# Patient Record
Sex: Female | Born: 1965
Health system: Southern US, Community
[De-identification: ages and names within clinical notes are randomized; demographics above are authoritative.]

## PROBLEM LIST (undated history)

## (undated) DIAGNOSIS — Z9889 Other specified postprocedural states: Secondary | ICD-10-CM

## (undated) DIAGNOSIS — R112 Nausea with vomiting, unspecified: Secondary | ICD-10-CM

## (undated) DIAGNOSIS — K2970 Gastritis, unspecified, without bleeding: Secondary | ICD-10-CM

## (undated) DIAGNOSIS — M5030 Other cervical disc degeneration, unspecified cervical region: Secondary | ICD-10-CM

## (undated) DIAGNOSIS — J189 Pneumonia, unspecified organism: Secondary | ICD-10-CM

## (undated) DIAGNOSIS — G8929 Other chronic pain: Secondary | ICD-10-CM

## (undated) DIAGNOSIS — E119 Type 2 diabetes mellitus without complications: Secondary | ICD-10-CM

## (undated) DIAGNOSIS — F419 Anxiety disorder, unspecified: Secondary | ICD-10-CM

## (undated) DIAGNOSIS — K219 Gastro-esophageal reflux disease without esophagitis: Secondary | ICD-10-CM

## (undated) DIAGNOSIS — M545 Low back pain, unspecified: Secondary | ICD-10-CM

## (undated) DIAGNOSIS — M5412 Radiculopathy, cervical region: Secondary | ICD-10-CM

## (undated) DIAGNOSIS — J309 Allergic rhinitis, unspecified: Secondary | ICD-10-CM

## (undated) HISTORY — DX: Low back pain, unspecified: M54.50

## (undated) HISTORY — PX: RIGHT OOPHORECTOMY: SHX2359

## (undated) HISTORY — DX: Allergic rhinitis, unspecified: J30.9

## (undated) HISTORY — DX: Other chronic pain: G89.29

## (undated) HISTORY — DX: Gastritis, unspecified, without bleeding: K29.70

## (undated) HISTORY — PX: TUBAL LIGATION: SHX77

## (undated) HISTORY — DX: Pneumonia, unspecified organism: J18.9

## (undated) HISTORY — DX: Anxiety disorder, unspecified: F41.9

## (undated) HISTORY — PX: ABDOMINAL HYSTERECTOMY: SHX81

## (undated) HISTORY — DX: Low back pain: M54.5

---

## 2003-05-01 DIAGNOSIS — L709 Acne, unspecified: Secondary | ICD-10-CM | POA: Insufficient documentation

## 2005-01-12 DIAGNOSIS — F4321 Adjustment disorder with depressed mood: Secondary | ICD-10-CM | POA: Insufficient documentation

## 2005-01-12 DIAGNOSIS — Z634 Disappearance and death of family member: Secondary | ICD-10-CM | POA: Insufficient documentation

## 2007-10-02 ENCOUNTER — Ambulatory Visit: Payer: Self-pay | Admitting: Obstetrics and Gynecology

## 2007-10-10 ENCOUNTER — Ambulatory Visit: Payer: Self-pay | Admitting: Obstetrics and Gynecology

## 2008-09-08 DIAGNOSIS — Z90721 Acquired absence of ovaries, unilateral: Secondary | ICD-10-CM | POA: Insufficient documentation

## 2012-07-28 ENCOUNTER — Emergency Department: Payer: Self-pay | Admitting: Emergency Medicine

## 2014-03-20 DIAGNOSIS — Z90711 Acquired absence of uterus with remaining cervical stump: Secondary | ICD-10-CM | POA: Insufficient documentation

## 2014-03-20 DIAGNOSIS — M5030 Other cervical disc degeneration, unspecified cervical region: Secondary | ICD-10-CM | POA: Insufficient documentation

## 2014-03-31 DIAGNOSIS — M5412 Radiculopathy, cervical region: Secondary | ICD-10-CM | POA: Insufficient documentation

## 2015-07-14 ENCOUNTER — Emergency Department
Admission: EM | Admit: 2015-07-14 | Discharge: 2015-07-14 | Disposition: A | Discharge status: Home / Self Care | Payer: 59 | Attending: Emergency Medicine | Admitting: Emergency Medicine

## 2015-07-14 ENCOUNTER — Emergency Department: Payer: 59

## 2015-07-14 DIAGNOSIS — R110 Nausea: Secondary | ICD-10-CM

## 2015-07-14 DIAGNOSIS — J159 Unspecified bacterial pneumonia: Secondary | ICD-10-CM | POA: Diagnosis not present

## 2015-07-14 DIAGNOSIS — J019 Acute sinusitis, unspecified: Secondary | ICD-10-CM | POA: Diagnosis not present

## 2015-07-14 DIAGNOSIS — R05 Cough: Secondary | ICD-10-CM | POA: Diagnosis present

## 2015-07-14 DIAGNOSIS — J189 Pneumonia, unspecified organism: Secondary | ICD-10-CM

## 2015-07-14 LAB — CBC WITH DIFFERENTIAL/PLATELET
BASOS PCT: 1 %
Basophils Absolute: 0.1 10*3/uL (ref 0–0.1)
Eosinophils Absolute: 0.1 10*3/uL (ref 0–0.7)
Eosinophils Relative: 2 %
HEMATOCRIT: 40.5 % (ref 35.0–47.0)
HEMOGLOBIN: 13.9 g/dL (ref 12.0–16.0)
Lymphocytes Relative: 7 %
Lymphs Abs: 0.4 10*3/uL — ABNORMAL LOW (ref 1.0–3.6)
MCH: 29.8 pg (ref 26.0–34.0)
MCHC: 34.2 g/dL (ref 32.0–36.0)
MCV: 87.1 fL (ref 80.0–100.0)
MONOS PCT: 9 %
Monocytes Absolute: 0.5 10*3/uL (ref 0.2–0.9)
NEUTROS ABS: 4.1 10*3/uL (ref 1.4–6.5)
NEUTROS PCT: 81 %
Platelets: 200 10*3/uL (ref 150–440)
RBC: 4.66 MIL/uL (ref 3.80–5.20)
RDW: 13.3 % (ref 11.5–14.5)
WBC: 5.1 10*3/uL (ref 3.6–11.0)

## 2015-07-14 LAB — COMPREHENSIVE METABOLIC PANEL
ALBUMIN: 3.8 g/dL (ref 3.5–5.0)
ALK PHOS: 83 U/L (ref 38–126)
ALT: 42 U/L (ref 14–54)
ANION GAP: 6 (ref 5–15)
AST: 30 U/L (ref 15–41)
BILIRUBIN TOTAL: 0.5 mg/dL (ref 0.3–1.2)
BUN: 11 mg/dL (ref 6–20)
CALCIUM: 8.8 mg/dL — AB (ref 8.9–10.3)
CO2: 26 mmol/L (ref 22–32)
CREATININE: 0.68 mg/dL (ref 0.44–1.00)
Chloride: 107 mmol/L (ref 101–111)
GFR calc Af Amer: >60 mL/min (ref >60)
GFR calc non Af Amer: >60 mL/min (ref >60)
GLUCOSE: 145 mg/dL — AB (ref 65–99)
Potassium: 3.8 mmol/L (ref 3.5–5.1)
Sodium: 139 mmol/L (ref 135–145)
TOTAL PROTEIN: 7.1 g/dL (ref 6.5–8.1)

## 2015-07-14 LAB — URINALYSIS COMPLETE WITH MICROSCOPIC (ARMC ONLY)
BILIRUBIN URINE: NEGATIVE
GLUCOSE, UA: 150 mg/dL — AB
Ketones, ur: NEGATIVE mg/dL
Leukocytes, UA: NEGATIVE
Nitrite: NEGATIVE
Protein, ur: NEGATIVE mg/dL
Specific Gravity, Urine: 1.024 (ref 1.005–1.030)
pH: 5 (ref 5.0–8.0)

## 2015-07-14 LAB — RAPID INFLUENZA A&B ANTIGENS: Influenza B (ARMC): NOT DETECTED

## 2015-07-14 LAB — RAPID INFLUENZA A&B ANTIGENS (ARMC ONLY): INFLUENZA A (ARMC): NOT DETECTED

## 2015-07-14 LAB — POCT RAPID STREP A: STREPTOCOCCUS, GROUP A SCREEN (DIRECT): NEGATIVE

## 2015-07-14 MED ORDER — ACETAMINOPHEN 500 MG PO TABS
1000.0000 mg | ORAL_TABLET | Freq: Once | ORAL | Status: AC
  Administered 2015-07-14: 1000 mg via ORAL
  Filled 2015-07-14: qty 2

## 2015-07-14 MED ORDER — SODIUM CHLORIDE 0.9 % IV SOLN
Freq: Once | INTRAVENOUS | Status: AC
  Administered 2015-07-14: 07:00:00 via INTRAVENOUS

## 2015-07-14 MED ORDER — LIDOCAINE VISCOUS 2 % MT SOLN
15.0000 mL | Freq: Once | OROMUCOSAL | Status: AC
  Administered 2015-07-14: 15 mL via OROMUCOSAL
  Filled 2015-07-14: qty 15

## 2015-07-14 MED ORDER — HYDROCOD POLST-CPM POLST ER 10-8 MG/5ML PO SUER: 5.0000 mL | Freq: Two times a day (BID) | ORAL | Status: DC

## 2015-07-14 MED ORDER — LEVOFLOXACIN IN D5W 750 MG/150ML IV SOLN
750.0000 mg | Freq: Once | INTRAVENOUS | Status: AC
  Administered 2015-07-14: 750 mg via INTRAVENOUS
  Filled 2015-07-14: qty 150

## 2015-07-14 MED ORDER — LEVOFLOXACIN 750 MG PO TABS: 750.0000 mg | ORAL_TABLET | Freq: Every day | ORAL | Status: DC

## 2015-07-14 MED ORDER — IOHEXOL 350 MG/ML SOLN
100.0000 mL | Freq: Once | INTRAVENOUS | Status: AC | PRN
  Administered 2015-07-14: 100 mL via INTRAVENOUS

## 2015-07-14 MED ORDER — ONDANSETRON HCL 4 MG/2ML IJ SOLN
4.0000 mg | Freq: Once | INTRAMUSCULAR | Status: AC
  Administered 2015-07-14: 4 mg via INTRAVENOUS
  Filled 2015-07-14: qty 2

## 2015-07-14 MED ORDER — ONDANSETRON HCL 4 MG PO TABS: 4.0000 mg | ORAL_TABLET | Freq: Every day | ORAL | Status: DC | PRN

## 2015-07-14 NOTE — ED Provider Notes (Addendum)
[facility] Emergency Department Provider Note     Time seen: ----------------------------------------- 7:30 AM on 07/14/2015 -----------------------------------------    I have reviewed the triage vital signs and the nursing notes.   HISTORY  Chief Complaint Cough    HPI Tara Young is a 50 y.o. female who presents ER with cough, fever, sore throat since Thursday. Patient said increasing cough shortness of breath. She's been taking over-the-counter medications without any relief. She's had persistent pain with deep breathing and cough, also sinus pressure. Nothing is made her symptoms better.   History reviewed. No pertinent past medical history.  There are no active problems to display for this patient.   Past Surgical History  Procedure Laterality Date  . Abdominal hysterectomy      Allergies Review of patient's allergies indicates no known allergies.  Social History Social History  Substance Use Topics  . Smoking status: Never Smoker   . Smokeless tobacco: None  . Alcohol Use: No    Review of Systems Constitutional: Positive for fever Eyes: Negative for visual changes. ENT: Positive for sore throat and sinus pressure Cardiovascular: Negative for chest pain. Respiratory: Positive for shortness of breath and cough Gastrointestinal: Negative for abdominal pain, positive for nausea Genitourinary: Negative for dysuria. Musculoskeletal: Negative for back pain. Skin: Negative for rash. Neurological: Negative for headaches, positive for weakness  10-point ROS otherwise negative.  ____________________________________________   PHYSICAL EXAM:  VITAL SIGNS: ED Triage Vitals  Enc Vitals Group     BP 07/14/15 0604 136/99 mmHg     Pulse Rate 07/14/15 0604 122     Resp 07/14/15 0604 18     Temp 07/14/15 0604 101 F (38.3 C)     Temp Source 07/14/15 0604 Oral     SpO2 07/14/15 0604 96 %     Weight 07/14/15 0604 180 lb (81.647  kg)     Height 07/14/15 0604 5\' 6"  (1.676 m)     Head Cir --      Peak Flow --      Pain Score 07/14/15 0605 4     Pain Loc --      Pain Edu? --      Excl. in [place]? --     Constitutional: Alert and oriented. Well appearing and in no distress. Eyes: Conjunctivae are normal. PERRL. Normal extraocular movements. ENT   Head: Normocephalic and atraumatic. Tenderness over the maxillary sinuses bilaterally   Nose: No congestion/rhinnorhea.   Mouth/Throat: Mucous membranes are moist.   Neck: No stridor. Cardiovascular: Normal rate, regular rhythm. Normal and symmetric distal pulses are present in all extremities. No murmurs, rubs, or gallops. Respiratory: Normal respiratory effort without tachypnea nor retractions. Breath sounds are clear and equal bilaterally. No wheezes/rales/rhonchi. Gastrointestinal: Soft and nontender. No distention. No abdominal bruits.  Musculoskeletal: Nontender with normal range of motion in all extremities. No joint effusions.  No lower extremity tenderness nor edema. Neurologic:  Normal speech and language. No gross focal neurologic deficits are appreciated. Speech is normal. No gait instability. Skin:  Skin is warm, dry and intact. No rash noted. Psychiatric: Mood and affect are normal. Speech and behavior are normal. Patient exhibits appropriate insight and judgment. ____________________________________________  ED COURSE:  Pertinent labs & imaging results that were available during my care of the patient were reviewed by me and considered in my medical decision making (see chart for details). Patient is in no acute distress, will check basic labs and reevaluate. Also obtained chest x-ray ____________________________________________  LABS (pertinent positives/negatives)  Labs Reviewed  CBC WITH DIFFERENTIAL/PLATELET - Abnormal; Notable for the following:    Lymphs Abs 0.4 (*)    All other components within normal limits  COMPREHENSIVE METABOLIC  PANEL - Abnormal; Notable for the following:    Glucose, Bld 145 (*)    Calcium 8.8 (*)    All other components within normal limits  URINALYSIS COMPLETEWITH MICROSCOPIC ([facility] ONLY) - Abnormal; Notable for the following:    Color, Urine YELLOW (*)    APPearance CLEAR (*)    Glucose, UA 150 (*)    Hgb urine dipstick 1+ (*)    Bacteria, UA MANY (*)    Squamous Epithelial / LPF 0-5 (*)    All other components within normal limits  RAPID INFLUENZA A&B ANTIGENS ([facility] ONLY)  POCT RAPID STREP A   EKG: Interpreted by me, sinus tachycardia with a rate of 125 bpm, normal PR interval, normal QRS, normal QT interval. Left anterior fascicular block  RADIOLOGY  Chest x-ray IMPRESSION: Mild patchy bilateral airspace opacities raise concern for pneumonia. ____________________________________________  FINAL ASSESSMENT AND PLAN  Pneumonia, sinusitis  Plan: Patient with labs and imaging as dictated above. Patient does not appear to be in any acute distress, she was started on IV fluids as well as IV Zofran and Levaquin. She will be discharged with Levaquin, Tussionex and Zofran. She is stable for outpatient follow-up with her doctor.   Earleen Newport, MD   Earleen Newport, MD 07/14/15 [id number]  Earleen Newport, MD 07/14/15 [phone]

## 2015-07-14 NOTE — ED Notes (Addendum)
Pt c/o cough with fever and sore throat intermit since Thursday night.states some CP with cough and also increased SOB. Denies any body aches or chills. Pt A&O. Pt has been taking niquel and ibuprofen at home with no relief

## 2015-07-14 NOTE — ED Notes (Signed)
Pt arrived to ED with c/o cough, chills, and pain in chest with deep breathing and cough.

## 2015-07-14 NOTE — ED Notes (Signed)
MD at bedside. 

## 2015-07-14 NOTE — Discharge Instructions (Signed)
Community-Acquired Pneumonia, Adult Pneumonia is an infection of the lungs. There are different types of pneumonia. One type can develop while a person is in a hospital. A different type, called community-acquired pneumonia, develops in people who are not, or have not recently been, in the hospital or other health care facility.  CAUSES Pneumonia may be caused by bacteria, viruses, or funguses. Community-acquired pneumonia is often caused by Streptococcus pneumonia bacteria. These bacteria are often passed from one person to another by breathing in droplets from the cough or sneeze of an infected person. RISK FACTORS The condition is more likely to develop in:  People who havechronic diseases, such as chronic obstructive pulmonary disease (COPD), asthma, congestive heart failure, cystic fibrosis, diabetes, or kidney disease.  People who haveearly-stage or late-stage HIV.  People who havesickle cell disease.  People who havehad their spleen removed (splenectomy).  People who havepoor Human resources officer.  People who havemedical conditions that increase the risk of breathing in (aspirating) secretions their own mouth and nose.   People who havea weakened immune system (immunocompromised).  People who smoke.  People whotravel to areas where pneumonia-causing germs commonly exist.  People whoare around animal habitats or animals that have pneumonia-causing germs, including birds, bats, rabbits, cats, and farm animals. SYMPTOMS Symptoms of this condition include:  Adry cough.  A wet (productive) cough.  Fever.  Sweating.  Chest pain, especially when breathing deeply or coughing.  Rapid breathing or difficulty breathing.  Shortness of breath.  Shaking chills.  Fatigue.  Muscle aches. DIAGNOSIS Your health care provider will take a medical history and perform a physical exam. You may also have other tests, including:  Imaging studies of your chest, including  X-rays.  Tests to check your blood oxygen level and other blood gases.  Other tests on blood, mucus (sputum), fluid around your lungs (pleural fluid), and urine. If your pneumonia is severe, other tests may be done to identify the specific cause of your illness. TREATMENT The type of treatment that you receive depends on many factors, such as the cause of your pneumonia, the medicines you take, and other medical conditions that you have. For most adults, treatment and recovery from pneumonia may occur at home. In some cases, treatment must happen in a hospital. Treatment may include:  Antibiotic medicines, if the pneumonia was caused by bacteria.  Antiviral medicines, if the pneumonia was caused by a virus.  Medicines that are given by mouth or through an IV tube.  Oxygen.  Respiratory therapy. Although rare, treating severe pneumonia may include:  Mechanical ventilation. This is done if you are not breathing well on your own and you cannot maintain a safe blood oxygen level.  Thoracentesis. This procedureremoves fluid around one lung or both lungs to help you breathe better. HOME CARE INSTRUCTIONS  Take over-the-counter and prescription medicines only as told by your health care provider.  Only takecough medicine if you are losing sleep. Understand that cough medicine can prevent your body's natural ability to remove mucus from your lungs.  If you were prescribed an antibiotic medicine, take it as told by your health care provider. Do not stop taking the antibiotic even if you start to feel better.  Sleep in a semi-upright position at night. Try sleeping in a reclining chair, or place a few pillows under your head.  Do not use tobacco products, including cigarettes, chewing tobacco, and e-cigarettes. If you need help quitting, ask your health care provider.  Drink enough water to keep your urine  clear or pale yellow. This will help to thin out mucus secretions in your  lungs. PREVENTION There are ways that you can decrease your risk of developing community-acquired pneumonia. Consider getting a pneumococcal vaccine if:  You are older than 50 years of age.  You are older than 50 years of age and are undergoing cancer treatment, have chronic lung disease, or have other medical conditions that affect your immune system. Ask your health care provider if this applies to you. There are different types and schedules of pneumococcal vaccines. Ask your health care provider which vaccination option is best for you. You may also prevent community-acquired pneumonia if you take these actions:  Get an influenza vaccine every year. Ask your health care provider which type of influenza vaccine is best for you.  Go to the dentist on a regular basis.  Wash your hands often. Use hand sanitizer if soap and water are not available. SEEK MEDICAL CARE IF:  You have a fever.  You are losing sleep because you cannot control your cough with cough medicine. SEEK IMMEDIATE MEDICAL CARE IF:  You have worsening shortness of breath.  You have increased chest pain.  Your sickness becomes worse, especially if you are an older adult or have a weakened immune system.  You cough up blood.   This information is not intended to replace advice given to you by your health care provider. Make sure you discuss any questions you have with your health care provider.   Document Released: 05/08/2005 Document Revised: 01/27/2015 Document Reviewed: 09/02/2014 Elsevier Interactive Patient Education 2016 Else[employer]  Nausea, Adult Nausea means you feel sick to your stomach or need to throw up (vomit). It may be a sign of a more serious problem. If nausea gets worse, you may throw up. If you throw up a lot, you may lose too much body fluid (dehydration). HOME CARE   Get plenty of rest.  Ask your doctor how to replace body fluid losses (rehydrate).  Eat small amounts of food. Sip  liquids more often.  Take all medicines as told by your doctor. GET HELP RIGHT AWAY IF:  You have a fever.  You pass out (faint).  You keep throwing up or have blood in your throw up.  You are very weak, have dry lips or a dry mouth, or you are very thirsty (dehydrated).  You have dark or bloody poop (stool).  You have very bad chest or belly (abdominal) pain.  You do not get better after 2 days, or you get worse.  You have a headache. MAKE SURE YOU:  Understand these instructions.  Will watch your condition.  Will get help right away if you are not doing well or get worse.   This information is not intended to replace advice given to you by your health care provider. Make sure you discuss any questions you have with your health care provider.   Document Released: 04/27/2011 Document Revised: 07/31/2011 Document Reviewed: 04/27/2011 Elsevier Interactive Patient Education 2016 Elsevier Inc.  Sinusitis, Adult Sinusitis is redness, soreness, and inflammation of the paranasal sinuses. Paranasal sinuses are air pockets within the bones of your face. They are located beneath your eyes, in the middle of your forehead, and above your eyes. In healthy paranasal sinuses, mucus is able to drain out, and air is able to circulate through them by way of your nose. However, when your paranasal sinuses are inflamed, mucus and air can become trapped. This can allow bacteria and other  germs to grow and cause infection. Sinusitis can develop quickly and last only a short time (acute) or continue over a long period (chronic). Sinusitis that lasts for more than 12 weeks is considered chronic. CAUSES Causes of sinusitis include:  Allergies.  Structural abnormalities, such as displacement of the cartilage that separates your nostrils (deviated septum), which can decrease the air flow through your nose and sinuses and affect sinus drainage.  Functional abnormalities, such as when the small hairs  (cilia) that line your sinuses and help remove mucus do not work properly or are not present. SIGNS AND SYMPTOMS Symptoms of acute and chronic sinusitis are the same. The primary symptoms are pain and pressure around the affected sinuses. Other symptoms include:  Upper toothache.  Earache.  Headache.  Bad breath.  Decreased sense of smell and taste.  A cough, which worsens when you are lying flat.  Fatigue.  Fever.  Thick drainage from your nose, which often is green and may contain pus (purulent).  Swelling and warmth over the affected sinuses. DIAGNOSIS Your health care provider will perform a physical exam. During your exam, your health care provider may perform any of the following to help determine if you have acute sinusitis or chronic sinusitis:  Look in your nose for signs of abnormal growths in your nostrils (nasal polyps).  Tap over the affected sinus to check for signs of infection.  View the inside of your sinuses using an imaging device that has a light attached (endoscope). If your health care provider suspects that you have chronic sinusitis, one or more of the following tests may be recommended:  Allergy tests.  Nasal culture. A sample of mucus is taken from your nose, sent to a lab, and screened for bacteria.  Nasal cytology. A sample of mucus is taken from your nose and examined by your health care provider to determine if your sinusitis is related to an allergy. TREATMENT Most cases of acute sinusitis are related to a viral infection and will resolve on their own within 10 days. Sometimes, medicines are prescribed to help relieve symptoms of both acute and chronic sinusitis. These may include pain medicines, decongestants, nasal steroid sprays, or saline sprays. However, for sinusitis related to a bacterial infection, your health care provider will prescribe antibiotic medicines. These are medicines that will help kill the bacteria causing the  infection. Rarely, sinusitis is caused by a fungal infection. In these cases, your health care provider will prescribe antifungal medicine. For some cases of chronic sinusitis, surgery is needed. Generally, these are cases in which sinusitis recurs more than 3 times per year, despite other treatments. HOME CARE INSTRUCTIONS  Drink plenty of water. Water helps thin the mucus so your sinuses can drain more easily.  Use a humidifier.  Inhale steam 3-4 times a day (for example, sit in the bathroom with the shower running).  Apply a warm, moist washcloth to your face 3-4 times a day, or as directed by your health care provider.  Use saline nasal sprays to help moisten and clean your sinuses.  Take medicines only as directed by your health care provider.  If you were prescribed either an antibiotic or antifungal medicine, finish it all even if you start to feel better. SEEK IMMEDIATE MEDICAL CARE IF:  You have increasing pain or severe headaches.  You have nausea, vomiting, or drowsiness.  You have swelling around your face.  You have vision problems.  You have a stiff neck.  You have difficulty breathing.  This information is not intended to replace advice given to you by your health care provider. Make sure you discuss any questions you have with your health care provider.   Document Released: 05/08/2005 Document Revised: 05/29/2014 Document Reviewed: 05/23/2011 Elsevier Interactive Patient Education [employer].

## 2015-07-14 NOTE — ED Notes (Signed)
Returned from CT.

## 2015-07-14 NOTE — ED Notes (Signed)
Pt up and walked around room without nasal cannula, tolerated well. O2 sats stayed at 96%

## 2015-07-14 NOTE — ED Notes (Signed)
Pt c/o nausea again. Dry heaving when RN in room.

## 2015-07-14 NOTE — ED Notes (Signed)
Will DC patient once abx done, approx 90 min

## 2015-07-14 NOTE — ED Notes (Signed)
Pt given fluids, tolerating well. 

## 2015-08-10 ENCOUNTER — Other Ambulatory Visit: Payer: Self-pay | Admitting: Infectious Diseases

## 2015-08-10 DIAGNOSIS — J189 Pneumonia, unspecified organism: Secondary | ICD-10-CM

## 2015-08-13 ENCOUNTER — Ambulatory Visit
Admission: RE | Admit: 2015-08-13 | Discharge: 2015-08-13 | Disposition: A | Discharge status: Home / Self Care | Payer: 59 | Source: Ambulatory Visit | Attending: Infectious Diseases | Admitting: Infectious Diseases

## 2015-08-13 DIAGNOSIS — J189 Pneumonia, unspecified organism: Secondary | ICD-10-CM | POA: Diagnosis not present

## 2015-08-31 ENCOUNTER — Institutional Professional Consult (permissible substitution): Payer: 59 | Admitting: Internal Medicine

## 2015-10-12 ENCOUNTER — Telehealth: Payer: Self-pay

## 2015-10-12 NOTE — Telephone Encounter (Signed)
Called Dr.Fitzgerald's office to request records for pt. Upcoming appt 5/25. Was told nurse would be faxing records over. Nothing further needed.

## 2015-10-14 ENCOUNTER — Other Ambulatory Visit
Admission: RE | Admit: 2015-10-14 | Discharge: 2015-10-14 | Disposition: A | Discharge status: Home / Self Care | Payer: 59 | Source: Ambulatory Visit | Attending: Internal Medicine | Admitting: Internal Medicine

## 2015-10-14 ENCOUNTER — Ambulatory Visit (INDEPENDENT_AMBULATORY_CARE_PROVIDER_SITE_OTHER): Payer: 59 | Admitting: Internal Medicine

## 2015-10-14 ENCOUNTER — Encounter: Payer: Self-pay | Admitting: Internal Medicine

## 2015-10-14 VITALS — BP 130/76 | HR 101 | Ht 66.0 in | Wt 200.0 lb

## 2015-10-14 DIAGNOSIS — D861 Sarcoidosis of lymph nodes: Secondary | ICD-10-CM | POA: Diagnosis not present

## 2015-10-14 DIAGNOSIS — Z0000 Encounter for general adult medical examination without abnormal findings: Secondary | ICD-10-CM | POA: Diagnosis not present

## 2015-10-14 DIAGNOSIS — R590 Localized enlarged lymph nodes: Secondary | ICD-10-CM

## 2015-10-14 MED ORDER — ALBUTEROL SULFATE HFA 108 (90 BASE) MCG/ACT IN AERS: 2.0000 | INHALATION_SPRAY | Freq: Four times a day (QID) | RESPIRATORY_TRACT | Status: DC | PRN

## 2015-10-14 MED ORDER — FLUTICASONE PROPIONATE 50 MCG/ACT NA SUSP: 1.0000 | Freq: Every day | NASAL | Status: DC

## 2015-10-14 NOTE — Patient Instructions (Addendum)
--  Serum ACE (angiotensin converting enzyme).  --Full PFT (before next visit in 6 months).  --Ct scan with contrast in 6 months-- follow up after that.  --Flonase 2 sprays in each nostril once daily.  --Proair inhaler, 2 puffs as needed for trouble breathing.

## 2015-10-14 NOTE — Progress Notes (Addendum)
Tara Young     Addendum 06/12/16 Patient underwent CT of the abdomen on 03/02/16 secondary to adnexal mass, images reviewed. Lower parts of the lungs were seen, showed no change in previously seen interstitial changes, however, the upper parts, however, the upper parts of the lungs where lymphadenopathy and interstitial changes were greatest were not seen on the scan.  I will wait to see her in follow-up and see if she is symptomatic, in regards to whether we will start treatment for sarcoidosis, versus continued surveillance. Given her 3 CT scans in the past year. I would prefer not to repeat another CT scan at this time.   Assessment and Plan:  Mediastinal lymphadenopathy. -Calcified lymphadenopathy with scattered nodular changes seen in both lungs, greatest in the right middle lobe and lingula, these changes on the most recent scan from 08/13/15 appear improved from previous film from 1 month prior. -These changes would be most consistent with granulomatous disease, possibly sarcoidosis. Given that her symptoms have dramatically improved, I would not proceed with invasive testing at this time, but we'll continue to monitor with repeat CAT scan in approximately 6 months. -We'll check a serum ace level. -PFT before next visit  Asthma. -She continues to have occasional episodic dyspnea which happens once a week or less. -She is given a prescription for albuterol and inhaler to be used when necessary.  Pneumonia. -As above, likely due to granulomatous disease. This appears to have resolved.  Allergic Rhinitis.  -The patient has a cough, which she feels is associated with postnasal drip. -We'll start Flonase nasal spray daily.  Date: 10/14/2015  MRN# [id number] Tara Young 1966/03/29  Referring Physician: Dr. Ola Young.   Tara Young is a 50 y.o. old female seen in [place] for chief complaint of:    Chief Complaint  Patient presents with    . pulmonary consult    pt ref by dr. Ola Young.     HPI:   Her problems started in February, they came on very quick with flu like symptoms and fevers. She went to the hospital with 104 fever. Her oxygen level was slightly low, she was diagnosed with atypical pneumonia. She was treated with levaquin and then a course of ceftin.  At that time she denied rash, but she does have a history of hives. She gets blister and hives on her fingers and toes, they come about every month. She used to take an allergy pill which helped.   She has always lived in [place] all her life. She travelled to [place] once in 2005, they have travelled to [facility]) once. No other travel. Currently she is feeling better she has an occasional cough which lingers slightly. Her cough is significantly better and she continues to have some sputum. She has nasal drainage which she feels is making her cough. She has tried taking claritin but it makes her groggy.  She did allergy shots about 10 years ago and was doing very well with allergies until this past year. She also notes she has a non-first-degree relative with a diagnosis of sarcoidosis.  CT chest images reviewed from 3/24/1 and compared with previous film from 07/14/15:: Scattered tree-in-bud/nodular changes throughout the mid zones bilaterally, which are improved on the most recent films.. These appear most abundant in the right middle lobe and the lingula. Enlarged subcarinal and R4 lymph nodes, with some slight right hilar enlargement  Review of outside records and [place] clinic: She was seen by the infectious disease  clinic on 3/13, she was sent for sputum, AFB, HIV, mycoplasma serology. Sputum culture showed respiratory flora, blood cultures were negative, HIV influenza negative. Mycoplasma IgM is negative, IgG was positive, indicative of previous or recent infection.   PMHX:   Past Medical History  Diagnosis Date  . PNA (pneumonia)    Surgical Hx:  Past  Surgical History  Procedure Laterality Date  . Abdominal hysterectomy     Family Hx:  Family History  Problem Relation Age of Onset  . Cancer Father    Social Hx:   Social History  Substance Use Topics  . Smoking status: Never Smoker   . Smokeless tobacco: Not on file  . Alcohol Use: No   Medication:   Current Outpatient Rx  Name  Route  Sig  Dispense  Refill  . chlorpheniramine-HYDROcodone (TUSSIONEX PENNKINETIC ER) 10-8 MG/5ML SUER   Oral   Take 5 mLs by mouth 2 (two) times daily.   140 mL   0   . levofloxacin (LEVAQUIN) 750 MG tablet   Oral   Take 1 tablet (750 mg total) by mouth daily.   5 tablet   0   . ondansetron (ZOFRAN) 4 MG tablet   Oral   Take 1 tablet (4 mg total) by mouth daily as needed for nausea or vomiting.   20 tablet   1       Allergies:  Review of patient's allergies indicates no known allergies.  Review of Systems: Gen:  Denies  fever, sweats, chills HEENT: Denies blurred vision, double vision. bleeds, Cvc:  No dizziness, chest pain. Resp:   Denies cough or sputum production,  Gi: Denies swallowing difficulty, stomach pain. Gu:  Denies bladder incontinence, burning urine Ext:   No Joint pain, stiffness. Skin: No skin rash,  hives  Endoc:  No polyuria, polydipsia. Psych: No depression, insomnia. Other:  All other systems were reviewed with the patient and were negative other that what is mentioned in the HPI.   Physical Examination:   VS: BP 130/76 mmHg  Pulse 101  Ht 5\' 6"  (1.676 m)  Wt 200 lb (90.719 kg)  BMI 32.30 kg/m2  SpO2 97%  General Appearance: No distress  Neuro:without focal findings,  speech normal,  HEENT: PERRLA, EOM intact.   Pulmonary: normal breath sounds, No wheezing.  CardiovascularNormal S1,S2.  No m/r/g.   Abdomen: Benign, Soft, non-tender. Renal:  No costovertebral tenderness  GU:  No performed at this time. Endoc: No evident thyromegaly, no signs of acromegaly. Skin:   warm, no rashes, no ecchymosis    Extremities: normal, no cyanosis, clubbing.  Other findings:    LABORATORY PANEL:   CBC No results for input(s): WBC, HGB, HCT, PLT in the last 168 hours. ------------------------------------------------------------------------------------------------------------------  Chemistries  No results for input(s): NA, K, CL, CO2, GLUCOSE, BUN, CREATININE, CALCIUM, MG, AST, ALT, ALKPHOS, BILITOT in the last 168 hours.  Invalid input(s): GFRCGP ------------------------------------------------------------------------------------------------------------------  Cardiac Enzymes No results for input(s): TROPONINI in the last 168 hours. ------------------------------------------------------------  RADIOLOGY:  No results found.     Thank  you for the [facility] and for allowing [facility] to assist in the care of your patient. Our recommendations are noted above.  Please contact us if we can be of further service.   Marda Stalker, MD.  Board Certified in Internal Medicine, Pulmonary Medicine, [place], and Sleep Medicine.  [facility] Office Number: [phone]  Patricia Pesa, M.D.  Vilinda Boehringer, M.D.  Merton Border, M.D  10/14/2015

## 2015-10-15 LAB — ANGIOTENSIN CONVERTING ENZYME: ANGIOTENSIN-CONVERTING ENZYME: 38 U/L (ref 14–82)

## 2016-02-14 ENCOUNTER — Other Ambulatory Visit: Payer: Self-pay | Admitting: Obstetrics and Gynecology

## 2016-02-14 DIAGNOSIS — R1011 Right upper quadrant pain: Secondary | ICD-10-CM

## 2016-02-15 ENCOUNTER — Other Ambulatory Visit: Payer: Self-pay | Admitting: Obstetrics and Gynecology

## 2016-02-15 DIAGNOSIS — R1030 Lower abdominal pain, unspecified: Secondary | ICD-10-CM

## 2016-02-15 DIAGNOSIS — R1011 Right upper quadrant pain: Secondary | ICD-10-CM

## 2016-02-22 ENCOUNTER — Other Ambulatory Visit: Payer: Self-pay | Admitting: Nurse Practitioner

## 2016-02-22 DIAGNOSIS — R1032 Left lower quadrant pain: Secondary | ICD-10-CM

## 2016-02-22 DIAGNOSIS — R1012 Left upper quadrant pain: Secondary | ICD-10-CM

## 2016-03-02 ENCOUNTER — Ambulatory Visit
Admission: RE | Admit: 2016-03-02 | Discharge: 2016-03-02 | Disposition: A | Discharge status: Home / Self Care | Payer: 59 | Source: Ambulatory Visit | Attending: Obstetrics and Gynecology | Admitting: Obstetrics and Gynecology

## 2016-03-02 DIAGNOSIS — R1011 Right upper quadrant pain: Secondary | ICD-10-CM | POA: Insufficient documentation

## 2016-03-02 DIAGNOSIS — R1031 Right lower quadrant pain: Secondary | ICD-10-CM | POA: Diagnosis present

## 2016-03-02 DIAGNOSIS — R1030 Lower abdominal pain, unspecified: Secondary | ICD-10-CM

## 2016-03-02 DIAGNOSIS — R918 Other nonspecific abnormal finding of lung field: Secondary | ICD-10-CM | POA: Diagnosis not present

## 2016-03-02 DIAGNOSIS — N839 Noninflammatory disorder of ovary, fallopian tube and broad ligament, unspecified: Secondary | ICD-10-CM | POA: Diagnosis not present

## 2016-03-02 MED ORDER — IOPAMIDOL (ISOVUE-300) INJECTION 61%
100.0000 mL | Freq: Once | INTRAVENOUS | Status: AC | PRN
  Administered 2016-03-02: 100 mL via INTRAVENOUS

## 2016-03-22 ENCOUNTER — Other Ambulatory Visit: Payer: Self-pay | Admitting: Internal Medicine

## 2016-04-21 ENCOUNTER — Encounter: Payer: Self-pay | Admitting: *Deleted

## 2016-04-24 ENCOUNTER — Encounter: Payer: Self-pay | Admitting: *Deleted

## 2016-04-24 ENCOUNTER — Ambulatory Visit: Payer: 59 | Admitting: Anesthesiology

## 2016-04-24 ENCOUNTER — Ambulatory Visit
Admission: RE | Admit: 2016-04-24 | Discharge: 2016-04-24 | Disposition: A | Discharge status: Home / Self Care | Payer: 59 | Source: Ambulatory Visit | Attending: Unknown Physician Specialty | Admitting: Unknown Physician Specialty

## 2016-04-24 ENCOUNTER — Encounter
Admission: RE | Disposition: A | Discharge status: Home / Self Care | Payer: Self-pay | Source: Ambulatory Visit | Attending: Unknown Physician Specialty

## 2016-04-24 DIAGNOSIS — Z90710 Acquired absence of both cervix and uterus: Secondary | ICD-10-CM | POA: Insufficient documentation

## 2016-04-24 DIAGNOSIS — R1031 Right lower quadrant pain: Secondary | ICD-10-CM | POA: Diagnosis present

## 2016-04-24 DIAGNOSIS — K2950 Unspecified chronic gastritis without bleeding: Secondary | ICD-10-CM | POA: Diagnosis not present

## 2016-04-24 DIAGNOSIS — M5030 Other cervical disc degeneration, unspecified cervical region: Secondary | ICD-10-CM | POA: Diagnosis not present

## 2016-04-24 DIAGNOSIS — K640 First degree hemorrhoids: Secondary | ICD-10-CM | POA: Insufficient documentation

## 2016-04-24 DIAGNOSIS — M1990 Unspecified osteoarthritis, unspecified site: Secondary | ICD-10-CM | POA: Diagnosis not present

## 2016-04-24 DIAGNOSIS — R1011 Right upper quadrant pain: Secondary | ICD-10-CM | POA: Diagnosis not present

## 2016-04-24 DIAGNOSIS — Z79899 Other long term (current) drug therapy: Secondary | ICD-10-CM | POA: Insufficient documentation

## 2016-04-24 DIAGNOSIS — M5412 Radiculopathy, cervical region: Secondary | ICD-10-CM | POA: Diagnosis not present

## 2016-04-24 HISTORY — DX: Radiculopathy, cervical region: M54.12

## 2016-04-24 HISTORY — PX: COLONOSCOPY WITH PROPOFOL: SHX5780

## 2016-04-24 HISTORY — DX: Other cervical disc degeneration, unspecified cervical region: M50.30

## 2016-04-24 HISTORY — PX: ESOPHAGOGASTRODUODENOSCOPY (EGD) WITH PROPOFOL: SHX5813

## 2016-04-24 SURGERY — COLONOSCOPY WITH PROPOFOL
Anesthesia: General

## 2016-04-24 MED ORDER — PROPOFOL 500 MG/50ML IV EMUL
INTRAVENOUS | Status: DC | PRN
  Administered 2016-04-24: 125 ug/kg/min via INTRAVENOUS

## 2016-04-24 MED ORDER — PHENYLEPHRINE HCL 10 MG/ML IJ SOLN
INTRAMUSCULAR | Status: DC | PRN
  Administered 2016-04-24: 50 ug via INTRAVENOUS
  Administered 2016-04-24: 100 ug via INTRAVENOUS

## 2016-04-24 MED ORDER — SODIUM CHLORIDE 0.9 % IV SOLN
INTRAVENOUS | Status: DC
  Administered 2016-04-24: 15:00:00 via INTRAVENOUS

## 2016-04-24 MED ORDER — PROPOFOL 10 MG/ML IV BOLUS
INTRAVENOUS | Status: DC | PRN
  Administered 2016-04-24: 20 mg via INTRAVENOUS
  Administered 2016-04-24: 50 mg via INTRAVENOUS
  Administered 2016-04-24: 30 mg via INTRAVENOUS

## 2016-04-24 MED ORDER — SODIUM CHLORIDE 0.9 % IV SOLN: INTRAVENOUS | Status: DC

## 2016-04-24 MED ORDER — MIDAZOLAM HCL 2 MG/2ML IJ SOLN
INTRAMUSCULAR | Status: DC | PRN
  Administered 2016-04-24: 1 mg via INTRAVENOUS

## 2016-04-24 NOTE — Op Note (Signed)
[facility] Patient Name: Tara Young Procedure Date: 04/24/2016 3:42 PM MRN: [id number] Account #: [id number] Date of Birth: [birth year] Admit Type: Outpatient Age: 50 Room: [facility] ENDO ROOM 4 Gender: Female Note Status: Finalized Procedure:            Upper GI endoscopy Indications:          Abdominal pain in the right upper quadrant Providers:            Manya Silvas, MD Referring MD:         Cecille Amsterdam. Helton, MD (Referring MD) Medicines:            Propofol per Anesthesia Complications:        No immediate complications. Procedure:            Pre-Anesthesia Assessment:                       - After reviewing the risks and benefits, the patient                        was deemed in satisfactory condition to undergo the                        procedure.                       After obtaining informed consent, the endoscope was                        passed under direct vision. Throughout the procedure,                        the patient's blood pressure, pulse, and oxygen                        saturations were monitored continuously. The Endoscope                        was introduced through the mouth, and advanced to the                        second part of duodenum. The upper GI endoscopy was                        accomplished without difficulty. The patient tolerated                        the procedure well. Findings:      The examined esophagus was normal. GEJ 38-39cm.      Patchy mildly erythematous mucosa without bleeding was found in the       gastric body. Biopsies were taken with a cold forceps for histology.       Biopsies were taken with a cold forceps for Helicobacter pylori testing.      Diffuse mildly erythematous mucosa without bleeding was found in the       gastric antrum. Biopsies were taken with a cold forceps for histology.       Biopsies were taken with a cold forceps for Helicobacter pylori testing.      Odd  shaped circular fold in antrum      The examined duodenum was normal. Impression:           -  Normal esophagus.                       - Erythematous mucosa in the gastric body. Biopsied.                       - Erythematous mucosa in the antrum. Biopsied.                       - Normal examined duodenum. Recommendation:       - Await pathology results.                       - Perform a colonoscopy as previously scheduled. Manya Silvas, MD 04/24/2016 3:57:26 PM This report has been signed electronically. Number of Addenda: 0 Note Initiated On: 04/24/2016 3:42 PM      [facility]

## 2016-04-24 NOTE — Transfer of Care (Signed)
Immediate Anesthesia Transfer of Care Note  Patient: JERAE TUTOR  Procedure(s) Performed: Procedure(s): COLONOSCOPY WITH PROPOFOL (N/A) ESOPHAGOGASTRODUODENOSCOPY (EGD) WITH PROPOFOL (N/A)  Patient Location: PACU  Anesthesia Type:General  Level of Consciousness: sedated  Airway & Oxygen Therapy: Patient Spontanous Breathing and Patient connected to nasal cannula oxygen  Post-op Assessment: Report given to RN and Post -op Vital signs reviewed and stable  Post vital signs: Reviewed and stable  Last Vitals:  Vitals:   04/24/16 1505 04/24/16 1627  BP: (!) 139/49 117/72  Pulse: (!) 105 71  Resp: 20 (!) 22  Temp: 37.4 C (!) 36.1 C    Last Pain:  Vitals:   04/24/16 1627  TempSrc: Temporal         Complications: No apparent anesthesia complications

## 2016-04-24 NOTE — Op Note (Signed)
[facility] Gastroenterology Patient Name: Tara Young Procedure Date: 04/24/2016 3:42 PM MRN: [id number] Account #: [id number] Date of Birth: May 11, 1966 Admit Type: Outpatient Age: 50 Room: [facility] ENDO ROOM 4 Gender: Female Note Status: Finalized Procedure:            Colonoscopy Indications:          Abdominal pain in the right lower quadrant, Abdominal                        pain in the right upper quadrant Providers:            Manya Silvas, MD Referring MD:         Cecille Amsterdam. Helton, MD (Referring MD) Medicines:            Propofol per Anesthesia Complications:        No immediate complications. Procedure:            Pre-Anesthesia Assessment:                       - After reviewing the risks and benefits, the patient                        was deemed in satisfactory condition to undergo the                        procedure.                       After obtaining informed consent, the colonoscope was                        passed under direct vision. Throughout the procedure,                        the patient's blood pressure, pulse, and oxygen                        saturations were monitored continuously. The                        Colonoscope was introduced through the anus and                        advanced to the the cecum, identified by appendiceal                        orifice and ileocecal valve. The colonoscopy was                        performed without difficulty. The patient tolerated the                        procedure well. The quality of the bowel preparation                        was adequate to identify polyps. Findings:      Internal hemorrhoids were found during endoscopy. The hemorrhoids were       small and Grade I (internal hemorrhoids that do not prolapse).      The exam was otherwise without abnormality. No mucosal inflammation, no  polyps, no diverticulosis. Impression:           - Internal hemorrhoids.           - The examination was otherwise normal.                       - No specimens collected. Recommendation:       - The findings and recommendations were discussed with                        the patient's family. Manya Silvas, MD 04/24/2016 4:25:44 PM This report has been signed electronically. Number of Addenda: 0 Note Initiated On: 04/24/2016 3:42 PM Scope Withdrawal Time: 0 hours 17 minutes 41 seconds  Total Procedure Duration: 0 hours 23 minutes 37 seconds       [facility]

## 2016-04-24 NOTE — Anesthesia Postprocedure Evaluation (Signed)
Anesthesia Post Note  Patient: Tara Young  Procedure(s) Performed: Procedure(s) (LRB): COLONOSCOPY WITH PROPOFOL (N/A) ESOPHAGOGASTRODUODENOSCOPY (EGD) WITH PROPOFOL (N/A)  Patient location during evaluation: Endoscopy Anesthesia Type: General Level of consciousness: awake and alert Pain management: pain level controlled Vital Signs Assessment: post-procedure vital signs reviewed and stable Respiratory status: spontaneous breathing, nonlabored ventilation, respiratory function stable and patient connected to nasal cannula oxygen Cardiovascular status: blood pressure returned to baseline and stable Postop Assessment: no signs of nausea or vomiting Anesthetic complications: no    Last Vitals:  Vitals:   04/24/16 1647 04/24/16 1657  BP: 110/62 110/73  Pulse: 82 78  Resp: 15 19  Temp:      Last Pain:  Vitals:   04/24/16 1627  TempSrc: Tympanic                 Ellason Segar S

## 2016-04-24 NOTE — H&P (Signed)
   Primary Care Physician:  Roselyn Meier, MD Primary Gastroenterologist:  Dr. Vira Agar  Pre-Procedure History & Physical: HPI:  Tara Young is a 50 y.o. female is here for an endoscopy and colonoscopy.   Past Medical History:  Diagnosis Date  . Cervical radiculitis   . DDD (degenerative disc disease), cervical   . PNA (pneumonia)     Past Surgical History:  Procedure Laterality Date  . ABDOMINAL HYSTERECTOMY      Prior to Admission medications   Medication Sig Start Date End Date Taking? Authorizing Provider  albuterol (PROVENTIL HFA;VENTOLIN HFA) 108 (90 Base) MCG/ACT inhaler Inhale 2 puffs into the lungs every 6 (six) hours as needed for wheezing or shortness of breath. 10/14/15  Yes Laverle Hobby, MD  fluticasone (FLONASE) 50 MCG/ACT nasal spray PLACE 1 SPRAY INTO BOTH NOSTRILS DAILY. 03/22/16  Yes Laverle Hobby, MD  ibuprofen (ADVIL,MOTRIN) 100 MG tablet Take 600-800 mg by mouth every 8 (eight) hours as needed for fever.    Yes Historical Provider, MD    Allergies as of 03/16/2016  . (No Known Allergies)    Family History  Problem Relation Age of Onset  . Cancer Father     Social History   Social History  . Marital status: Married    Spouse name: N/A  . Number of children: N/A  . Years of education: N/A   Occupational History  . Not on file.   Social History Main Topics  . Smoking status: Never Smoker  . Smokeless tobacco: Never Used  . Alcohol use No  . Drug use: No  . Sexual activity: Not on file   Other Topics Concern  . Not on file   Social History Narrative  . No narrative on file    Review of Systems: See HPI, otherwise negative ROS  Physical Exam: BP (!) 139/49   Pulse (!) 105   Temp 99.3 F (37.4 C) (Tympanic)   Resp 20   Ht 5\' 6"  (1.676 m)   Wt 82.6 kg (182 lb)   SpO2 100%   BMI 29.38 kg/m  General:   Alert,  pleasant and cooperative in NAD Head:  Normocephalic and atraumatic. Neck:  Supple; no masses or  thyromegaly. Lungs:  Clear throughout to auscultation.    Heart:  Regular rate and rhythm. Abdomen:  Soft, nontender and nondistended. Normal bowel sounds, without guarding, and without rebound.   Neurologic:  Alert and  oriented x4;  grossly normal neurologically.  Impression/Plan: Tara Young is here for an endoscopy and colonoscopy to be performed for RUQ and RLQ abdominal pain.  Risks, benefits, limitations, and alternatives regarding  endoscopy and colonoscopy have been reviewed with the patient.  Questions have been answered.  All parties agreeable.   Gaylyn Cheers, MD  04/24/2016, 3:42 PM

## 2016-04-24 NOTE — Anesthesia Preprocedure Evaluation (Signed)
Anesthesia Evaluation  Patient identified by MRN, date of birth, ID band Patient awake    Reviewed: Allergy & Precautions, NPO status , Patient's Chart, lab work & pertinent test results, reviewed documented beta blocker date and time   Airway Mallampati: II  TM Distance: >3 FB     Dental  (+) Chipped   Pulmonary pneumonia, resolved,           Cardiovascular      Neuro/Psych  Neuromuscular disease    GI/Hepatic   Endo/Other    Renal/GU      Musculoskeletal  (+) Arthritis ,   Abdominal   Peds  Hematology   Anesthesia Other Findings   Reproductive/Obstetrics                             Anesthesia Physical Anesthesia Plan  ASA: II  Anesthesia Plan: General   Post-op Pain Management:    Induction: Intravenous  Airway Management Planned:   Additional Equipment:   Intra-op Plan:   Post-operative Plan:   Informed Consent: I have reviewed the patients History and Physical, chart, labs and discussed the procedure including the risks, benefits and alternatives for the proposed anesthesia with the patient or authorized representative who has indicated his/her understanding and acceptance.     Plan Discussed with: CRNA  Anesthesia Plan Comments:         Anesthesia Quick Evaluation

## 2016-04-25 ENCOUNTER — Encounter: Payer: Self-pay | Admitting: Unknown Physician Specialty

## 2016-04-26 ENCOUNTER — Other Ambulatory Visit: Payer: 59

## 2016-04-26 LAB — SURGICAL PATHOLOGY

## 2016-04-27 NOTE — H&P (Signed)
Tara Young is a 50 y.o. female here forL/S  Right adnexal cystectomy . Pt with a 13x8 cm right adnexal cyst . S/P RSO  By dr Ferne Reus 09/2007 Pt with intermittent RUQ and some RLQ pain .  CA 125 wnl = 11 U/s done show anechoic  Area , no nodularity , no septi , GI has done an extensive w/up up which has been nl    Past Medical History:  has a past medical history of Abdominal pain, RUQ (right upper quadrant) (02/22/2016); Allergic rhinitis; Bilateral cataracts; Chronic low back pain; and Hives.  Past Surgical History:  has a past surgical history that includes Tubal ligation (Bilateral); Colonoscopy; Upper gastrointestinal endoscopy; Hysterectomy (1996); and Ovary removed 2009 (Right). Family History: family history includes Fibromyalgia in her mother; Lymphoma in her father; Stomach cancer in her father; Stroke in her mother. Social History:  reports that she has never smoked. She has never used smokeless tobacco. She reports that she drinks alcohol. OB/GYN History:          OB History    Gravida Para Term Preterm AB Living   2 2 2     2    SAB TAB Ectopic Molar Multiple Live Births             2      Allergies: has No Known Allergies. Medications:  Current Outpatient Prescriptions:  .  albuterol 90 mcg/actuation inhaler, Inhale into the lungs., Disp: , Rfl:  .  fluticasone (FLONASE) 50 mcg/actuation nasal spray, by Nasal route., Disp: , Rfl:  .  hydrOXYzine (ATARAX) 25 MG tablet, Take 25 mg by mouth once daily. Reported on 07/16/2015 , Disp: , Rfl:  .  ondansetron (ZOFRAN, AS HYDROCHLORIDE,) 4 MG tablet, Take by mouth., Disp: , Rfl:  .  pantoprazole (PROTONIX) 40 MG DR tablet, Take 1 tablet (40 mg total) by mouth once daily. Take 15-20 mins before meal (Patient not taking: Reported on 03/27/2016 ), Disp: 30 tablet, Rfl: 3 .  PARoxetine (PAXIL) 10 MG tablet, Take 1 tablet (10 mg total) by mouth once daily. (Patient not taking: Reported on 03/27/2016 ), Disp: 30 tablet, Rfl:  1 .  PARoxetine mesylate (BRISDELLE) 7.5 mg Cap, Take 7.5 mg by mouth nightly. (Patient not taking: Reported on 03/27/2016 ), Disp: 30 capsule, Rfl: 0  Review of Systems: General:                      No fatigue or weight loss Eyes:                           No vision changes Ears:                            No hearing difficulty Respiratory:                No cough or shortness of breath Pulmonary:                  No asthma or shortness of breath Cardiovascular:           No chest pain, palpitations, dyspnea on exertion Gastrointestinal:          No abdominal bloating, chronic diarrhea, constipations, masses, pain or hematochezia Genitourinary:             No hematuria, dysuria, abnormal vaginal discharge, pelvic pain, Menometrorrhagia Lymphatic:  No swollen lymph nodes Musculoskeletal:         No muscle weakness Neurologic:                  No extremity weakness, syncope, seizure disorder Psychiatric:                  No history of depression, delusions or suicidal/homicidal ideation    Exam:      Vitals:   03/27/16 1452  BP: 95/79  Pulse: 93    Body mass index is 30.18 kg/m.  WDWN white/  female in NAD   Lungs: CTA  CV : RRR without murmur   Neck:  no thyromegaly Abdomen: soft , no mass, normal active bowel sounds,  non-tender, no rebound tenderness Pelvic: tanner stage 5 ,  External genitalia: vulva /labia no lesions Urethra: no prolapse Vagina: normal physiologic d/c Cervix: no lesions, no cervical motion tenderness   Uterus: normal size shape and contour, non-tender Adnexa: right TTP + fullness     Impression:   The primary encounter diagnosis was Adnexal mass. A diagnosis of Lower abdominal pain was also pertinent to this visit. illdefined mass most c/w loculation of walled of fluid . ( with possible adhesions) given she is s/p RSO previously .Possible cancer , but unlikely .   Plan:    L/S eval and LOA and drainage of the cyst  . Possible exlap if adhesions are too dense .   The proposed benefit of the surgery has been discussed with the patient. The possible risks include, but are not limited to: organ injury to the bowel , bladder, ureters, and major blood vessels and nerves. There is a possibility of additional surgeries resulting from these injuries. There is also the risk of blood transfusion and the need to receive blood products during or after the procedure which may rarely lead to HIV or Hepatitis C infection. There is a risk of developing a deep venous thrombosis or a pulmonary embolism . There is the possibility of wound infection and also anesthetic complications, even the rare possibility of death. The patient understands these risks and wishes to proceed. All questions have been answered and the consent has been signed.   .   Return for preop.  Caroline Sauger, MD       Electronically signed by Caroline Sauger, MD at 11/6

## 2016-05-01 ENCOUNTER — Encounter
Admission: RE | Admit: 2016-05-01 | Discharge: 2016-05-01 | Disposition: A | Discharge status: Home / Self Care | Payer: 59 | Source: Ambulatory Visit | Attending: Obstetrics and Gynecology | Admitting: Obstetrics and Gynecology

## 2016-05-01 DIAGNOSIS — R1011 Right upper quadrant pain: Secondary | ICD-10-CM | POA: Insufficient documentation

## 2016-05-01 DIAGNOSIS — Z01812 Encounter for preprocedural laboratory examination: Secondary | ICD-10-CM | POA: Diagnosis not present

## 2016-05-01 DIAGNOSIS — R1031 Right lower quadrant pain: Secondary | ICD-10-CM | POA: Diagnosis not present

## 2016-05-01 HISTORY — DX: Other specified postprocedural states: Z98.890

## 2016-05-01 HISTORY — DX: Gastro-esophageal reflux disease without esophagitis: K21.9

## 2016-05-01 HISTORY — DX: Nausea with vomiting, unspecified: R11.2

## 2016-05-01 HISTORY — DX: Other specified postprocedural states: R11.2

## 2016-05-01 LAB — BASIC METABOLIC PANEL
Anion gap: 5 (ref 5–15)
BUN: 16 mg/dL (ref 6–20)
CALCIUM: 9.3 mg/dL (ref 8.9–10.3)
CO2: 29 mmol/L (ref 22–32)
CREATININE: 0.72 mg/dL (ref 0.44–1.00)
Chloride: 106 mmol/L (ref 101–111)
GFR calc Af Amer: >60 mL/min (ref >60)
GFR calc non Af Amer: >60 mL/min (ref >60)
GLUCOSE: 101 mg/dL — AB (ref 65–99)
Potassium: 4.1 mmol/L (ref 3.5–5.1)
Sodium: 140 mmol/L (ref 135–145)

## 2016-05-01 LAB — CBC
HCT: 39.7 % (ref 35.0–47.0)
Hemoglobin: 13.6 g/dL (ref 12.0–16.0)
MCH: 29.8 pg (ref 26.0–34.0)
MCHC: 34.3 g/dL (ref 32.0–36.0)
MCV: 86.9 fL (ref 80.0–100.0)
PLATELETS: 213 10*3/uL (ref 150–440)
RBC: 4.57 MIL/uL (ref 3.80–5.20)
RDW: 13.9 % (ref 11.5–14.5)
WBC: 4.1 10*3/uL (ref 3.6–11.0)

## 2016-05-01 LAB — TYPE AND SCREEN
ABO/RH(D): B POS
Antibody Screen: NEGATIVE

## 2016-05-01 NOTE — Patient Instructions (Signed)
Your procedure is scheduled on: Friday 05/05/16 Report to [facility]. 2ND FLOOR MEDICAL MALL ENTRANCE. To find out your arrival time please call [phone] between 1PM - 3PM on Thursday 05/04/16.  Remember: Instructions that are not followed completely may result in serious medical risk, up to and including death, or upon the discretion of your surgeon and anesthesiologist your surgery may need to be rescheduled.    __X__ 1. Do not eat food or drink liquids after midnight. No gum chewing or hard candies.     __X__ 2. No Alcohol for 24 hours before or after surgery.   ____ 3. Bring all medications with you on the day of surgery if instructed.    __X__ 4. Notify your doctor if there is any change in your medical condition     (cold, fever, infections).             ___X__5. No smoking within 24 hours of your surgery.     Do not wear jewelry, make-up, hairpins, clips or nail polish.  Do not wear lotions, powders, or perfumes.   Do not shave 48 hours prior to surgery. Men may shave face and neck.  Do not bring valuables to the hospital.    [facility] is not responsible for any belongings or valuables.               Contacts, dentures or bridgework may not be worn into surgery.  Leave your suitcase in the car. After surgery it may be brought to your room.  For patients admitted to the hospital, discharge time is determined by your                treatment team.   Patients discharged the day of surgery will not be allowed to drive home.   Please read over the following fact sheets that you were given:   Pain Booklet and Coughing and Deep Breathing INCENTIVE SPIROMETRY   __X__ Take these medicines the morning of surgery with A SIP OF WATER:    1. [place]  2.   3.   4.  5.  6.  __X__ Fleet Enema (as directed)   __X__ Use CHG Soap as directed  __X__ Use inhalers on the day of surgery  ____ Stop metformin 2 days prior to surgery    ____ Take 1/2 of usual insulin dose the  night before surgery and none on the morning of surgery.   ____ Stop Coumadin/Plavix/aspirin on   __X__ Stop Anti-inflammatories such as Advil, Aleve, Ibuprofen, Motrin, Naproxen, Naprosyn, Goodies,powder, or aspirin products.  OK to take Tylenol.   ____ Stop supplements until after surgery.    ____ Bring C-Pap to the hospital.

## 2016-05-05 ENCOUNTER — Ambulatory Visit: Payer: 59 | Admitting: Anesthesiology

## 2016-05-05 ENCOUNTER — Encounter
Admission: AD | Disposition: A | Discharge status: Home / Self Care | Payer: Self-pay | Source: Ambulatory Visit | Attending: Obstetrics and Gynecology

## 2016-05-05 ENCOUNTER — Observation Stay
Admission: AD | Admit: 2016-05-05 | Discharge: 2016-05-06 | Disposition: A | Discharge status: Home / Self Care | Payer: 59 | Source: Ambulatory Visit | Attending: Obstetrics & Gynecology | Admitting: Obstetrics & Gynecology

## 2016-05-05 DIAGNOSIS — M545 Low back pain: Secondary | ICD-10-CM | POA: Insufficient documentation

## 2016-05-05 DIAGNOSIS — M1990 Unspecified osteoarthritis, unspecified site: Secondary | ICD-10-CM | POA: Diagnosis not present

## 2016-05-05 DIAGNOSIS — D287 Benign neoplasm of other specified female genital organs: Principal | ICD-10-CM | POA: Insufficient documentation

## 2016-05-05 DIAGNOSIS — R102 Pelvic and perineal pain: Secondary | ICD-10-CM | POA: Insufficient documentation

## 2016-05-05 DIAGNOSIS — Z90710 Acquired absence of both cervix and uterus: Secondary | ICD-10-CM | POA: Diagnosis not present

## 2016-05-05 DIAGNOSIS — Z823 Family history of stroke: Secondary | ICD-10-CM | POA: Insufficient documentation

## 2016-05-05 DIAGNOSIS — K565 Intestinal adhesions [bands], unspecified as to partial versus complete obstruction: Secondary | ICD-10-CM

## 2016-05-05 DIAGNOSIS — K219 Gastro-esophageal reflux disease without esophagitis: Secondary | ICD-10-CM | POA: Insufficient documentation

## 2016-05-05 DIAGNOSIS — G8929 Other chronic pain: Secondary | ICD-10-CM | POA: Diagnosis not present

## 2016-05-05 DIAGNOSIS — Z885 Allergy status to narcotic agent status: Secondary | ICD-10-CM | POA: Insufficient documentation

## 2016-05-05 DIAGNOSIS — Z800 Family history of malignant neoplasm of digestive organs: Secondary | ICD-10-CM | POA: Diagnosis not present

## 2016-05-05 DIAGNOSIS — N83201 Unspecified ovarian cyst, right side: Secondary | ICD-10-CM | POA: Diagnosis not present

## 2016-05-05 DIAGNOSIS — K660 Peritoneal adhesions (postprocedural) (postinfection): Secondary | ICD-10-CM | POA: Insufficient documentation

## 2016-05-05 DIAGNOSIS — D271 Benign neoplasm of left ovary: Secondary | ICD-10-CM | POA: Insufficient documentation

## 2016-05-05 DIAGNOSIS — Z9889 Other specified postprocedural states: Secondary | ICD-10-CM

## 2016-05-05 DIAGNOSIS — D280 Benign neoplasm of vulva: Secondary | ICD-10-CM | POA: Diagnosis present

## 2016-05-05 HISTORY — PX: LAPAROSCOPIC LYSIS OF ADHESIONS: SHX5905

## 2016-05-05 HISTORY — PX: CYSTOSCOPY: SHX5120

## 2016-05-05 HISTORY — PX: LAPAROSCOPIC OVARIAN CYSTECTOMY: SHX6248

## 2016-05-05 HISTORY — PX: LAPAROSCOPIC SALPINGO OOPHERECTOMY: SHX5927

## 2016-05-05 LAB — ABO/RH: ABO/RH(D): B POS

## 2016-05-05 SURGERY — EXCISION, CYST, OVARY, LAPAROSCOPIC
Anesthesia: General | Laterality: Right

## 2016-05-05 MED ORDER — SUGAMMADEX SODIUM 200 MG/2ML IV SOLN
INTRAVENOUS | Status: DC | PRN
  Administered 2016-05-05: 200 mg via INTRAVENOUS

## 2016-05-05 MED ORDER — ROCURONIUM BROMIDE 100 MG/10ML IV SOLN
INTRAVENOUS | Status: DC | PRN
  Administered 2016-05-05: 20 mg via INTRAVENOUS
  Administered 2016-05-05: 40 mg via INTRAVENOUS
  Administered 2016-05-05 (×5): 10 mg via INTRAVENOUS
  Administered 2016-05-05: 20 mg via INTRAVENOUS

## 2016-05-05 MED ORDER — FENTANYL CITRATE (PF) 100 MCG/2ML IJ SOLN
INTRAMUSCULAR | Status: DC | PRN
  Administered 2016-05-05 (×7): 50 ug via INTRAVENOUS

## 2016-05-05 MED ORDER — SOD CITRATE-CITRIC ACID 500-334 MG/5ML PO SOLN
30.0000 mL | ORAL | Status: DC
  Filled 2016-05-05: qty 30

## 2016-05-05 MED ORDER — LACTATED RINGERS IV SOLN
INTRAVENOUS | Status: DC
  Administered 2016-05-05: 1000 mL via INTRAVENOUS

## 2016-05-05 MED ORDER — ACETAMINOPHEN 10 MG/ML IV SOLN
INTRAVENOUS | Status: AC
  Filled 2016-05-05: qty 100

## 2016-05-05 MED ORDER — LIDOCAINE HCL (CARDIAC) 20 MG/ML IV SOLN
INTRAVENOUS | Status: DC | PRN
  Administered 2016-05-05: 40 mg via INTRAVENOUS

## 2016-05-05 MED ORDER — BUTORPHANOL TARTRATE 1 MG/ML IJ SOLN
1.0000 mg | INTRAMUSCULAR | Status: DC | PRN
  Administered 2016-05-05: 1 mg via INTRAVENOUS
  Filled 2016-05-05: qty 1

## 2016-05-05 MED ORDER — OXYCODONE-ACETAMINOPHEN 5-325 MG PO TABS
1.0000 | ORAL_TABLET | ORAL | Status: DC | PRN
  Administered 2016-05-05 – 2016-05-06 (×4): 2 via ORAL
  Filled 2016-05-05 (×4): qty 2

## 2016-05-05 MED ORDER — PHENYLEPHRINE HCL 10 MG/ML IJ SOLN
INTRAMUSCULAR | Status: DC | PRN
  Administered 2016-05-05 (×2): 200 ug via INTRAVENOUS

## 2016-05-05 MED ORDER — SCOPOLAMINE 1 MG/3DAYS TD PT72
1.0000 | MEDICATED_PATCH | TRANSDERMAL | Status: DC
  Administered 2016-05-05: 1.5 mg via TRANSDERMAL

## 2016-05-05 MED ORDER — DEXAMETHASONE SODIUM PHOSPHATE 10 MG/ML IJ SOLN
INTRAMUSCULAR | Status: DC | PRN
  Administered 2016-05-05: 10 mg via INTRAVENOUS

## 2016-05-05 MED ORDER — BUPIVACAINE HCL 0.5 % IJ SOLN
INTRAMUSCULAR | Status: DC | PRN
  Administered 2016-05-05: 6 mL
  Administered 2016-05-05: 19 mL

## 2016-05-05 MED ORDER — FENTANYL CITRATE (PF) 100 MCG/2ML IJ SOLN
25.0000 ug | INTRAMUSCULAR | Status: DC | PRN
  Administered 2016-05-05 (×3): 50 ug via INTRAVENOUS

## 2016-05-05 MED ORDER — ACETAMINOPHEN 10 MG/ML IV SOLN
INTRAVENOUS | Status: DC | PRN
  Administered 2016-05-05: 1000 mg via INTRAVENOUS

## 2016-05-05 MED ORDER — FLEET ENEMA 7-19 GM/118ML RE ENEM: 1.0000 | ENEMA | Freq: Once | RECTAL | Status: DC

## 2016-05-05 MED ORDER — ONDANSETRON HCL 4 MG/2ML IJ SOLN
INTRAMUSCULAR | Status: DC | PRN
  Administered 2016-05-05: 4 mg via INTRAVENOUS

## 2016-05-05 MED ORDER — ONDANSETRON HCL 4 MG/2ML IJ SOLN: 4.0000 mg | Freq: Four times a day (QID) | INTRAMUSCULAR | Status: DC | PRN

## 2016-05-05 MED ORDER — LACTATED RINGERS IV SOLN
INTRAVENOUS | Status: DC
  Administered 2016-05-05 (×3): via INTRAVENOUS

## 2016-05-05 MED ORDER — MIDAZOLAM HCL 2 MG/2ML IJ SOLN
INTRAMUSCULAR | Status: DC | PRN
  Administered 2016-05-05: 2 mg via INTRAVENOUS

## 2016-05-05 MED ORDER — LACTATED RINGERS IV SOLN
INTRAVENOUS | Status: DC
  Administered 2016-05-05: 13:00:00 via INTRAVENOUS

## 2016-05-05 MED ORDER — FLUORESCEIN SODIUM 10 % IV SOLN
INTRAVENOUS | Status: AC
  Filled 2016-05-05: qty 5

## 2016-05-05 MED ORDER — OXYCODONE HCL 5 MG PO TABS: 5.0000 mg | ORAL_TABLET | Freq: Once | ORAL | Status: DC | PRN

## 2016-05-05 MED ORDER — PROPOFOL 10 MG/ML IV BOLUS
INTRAVENOUS | Status: DC | PRN
  Administered 2016-05-05: 150 mg via INTRAVENOUS

## 2016-05-05 MED ORDER — FLUORESCEIN SODIUM 10 % IV SOLN
INTRAVENOUS | Status: DC | PRN
  Administered 2016-05-05: .5 mL via INTRAVENOUS

## 2016-05-05 MED ORDER — FENTANYL CITRATE (PF) 100 MCG/2ML IJ SOLN
INTRAMUSCULAR | Status: AC
  Filled 2016-05-05: qty 2

## 2016-05-05 MED ORDER — OXYCODONE HCL 5 MG/5ML PO SOLN: 5.0000 mg | Freq: Once | ORAL | Status: DC | PRN

## 2016-05-05 MED ORDER — BUPIVACAINE HCL (PF) 0.5 % IJ SOLN
INTRAMUSCULAR | Status: AC
  Filled 2016-05-05: qty 30

## 2016-05-05 MED ORDER — ONDANSETRON HCL 4 MG PO TABS: 4.0000 mg | ORAL_TABLET | Freq: Four times a day (QID) | ORAL | Status: DC | PRN

## 2016-05-05 MED ORDER — KETOROLAC TROMETHAMINE 30 MG/ML IJ SOLN
30.0000 mg | Freq: Three times a day (TID) | INTRAMUSCULAR | Status: DC | PRN
  Administered 2016-05-05 – 2016-05-06 (×2): 30 mg via INTRAVENOUS
  Filled 2016-05-05 (×2): qty 1

## 2016-05-05 MED ORDER — SCOPOLAMINE 1 MG/3DAYS TD PT72
MEDICATED_PATCH | TRANSDERMAL | Status: AC
  Filled 2016-05-05: qty 1

## 2016-05-05 SURGICAL SUPPLY — 66 items
ANCHOR TIS RET SYS 235ML (MISCELLANEOUS) ×4 IMPLANT
APPLICATOR ARISTA FLEXITIP XL (MISCELLANEOUS) ×4 IMPLANT
BAG URO DRAIN 2000ML W/SPOUT (MISCELLANEOUS) ×4 IMPLANT
BLADE SURG SZ11 CARB STEEL (BLADE) ×4 IMPLANT
CANISTER SUCT 1200ML W/VALVE (MISCELLANEOUS) ×4 IMPLANT
CATH ROBINSON RED A/P 16FR (CATHETERS) ×4 IMPLANT
CATH TRAY 16F METER LATEX (MISCELLANEOUS) IMPLANT
CHLORAPREP W/TINT 26ML (MISCELLANEOUS) ×4 IMPLANT
DERMABOND ADVANCED (GAUZE/BANDAGES/DRESSINGS) ×1
DERMABOND ADVANCED .7 DNX12 (GAUZE/BANDAGES/DRESSINGS) ×3 IMPLANT
DISSECTOR KITTNER STICK (MISCELLANEOUS) IMPLANT
DISSECTORS/KITTNER STICK (MISCELLANEOUS)
DRAPE LAPAROTOMY TRNSV 106X77 (MISCELLANEOUS) IMPLANT
DRAPE STERI POUCH LG 24X46 STR (DRAPES) ×4 IMPLANT
DRSG TEGADERM 2-3/8X2-3/4 SM (GAUZE/BANDAGES/DRESSINGS) ×16 IMPLANT
DRSG TELFA 3X8 NADH (GAUZE/BANDAGES/DRESSINGS) IMPLANT
ELECT BLADE 6 FLAT ULTRCLN (ELECTRODE) IMPLANT
ELECT REM PT RETURN 9FT ADLT (ELECTROSURGICAL) ×4
ELECTRODE REM PT RTRN 9FT ADLT (ELECTROSURGICAL) ×3 IMPLANT
ENDOPOUCH RETRIEVER 10 (MISCELLANEOUS) IMPLANT
GAUZE SPONGE NON-WVN 2X2 STRL (MISCELLANEOUS) ×9 IMPLANT
GLOVE BIO SURGEON STRL SZ8 (GLOVE) ×8 IMPLANT
GLOVE INDICATOR 8.0 STRL GRN (GLOVE) ×16 IMPLANT
GOWN STRL REUS W/ TWL LRG LVL3 (GOWN DISPOSABLE) ×6 IMPLANT
GOWN STRL REUS W/ TWL XL LVL3 (GOWN DISPOSABLE) ×3 IMPLANT
GOWN STRL REUS W/TWL LRG LVL3 (GOWN DISPOSABLE) ×2
GOWN STRL REUS W/TWL XL LVL3 (GOWN DISPOSABLE) ×1
HEMOSTAT ARISTA ABSORB 3G PWDR (MISCELLANEOUS) ×4 IMPLANT
IRRIGATION STRYKERFLOW (MISCELLANEOUS) ×3 IMPLANT
IRRIGATOR STRYKERFLOW (MISCELLANEOUS) ×4
IV NS 1000ML (IV SOLUTION) ×1
IV NS 1000ML BAXH (IV SOLUTION) ×3 IMPLANT
KIT RM TURNOVER CYSTO AR (KITS) ×4 IMPLANT
LABEL OR SOLS (LABEL) ×4 IMPLANT
NS IRRIG 500ML POUR BTL (IV SOLUTION) ×4 IMPLANT
PACK BASIN MAJOR ARMC (MISCELLANEOUS) IMPLANT
PACK GYN LAPAROSCOPIC (MISCELLANEOUS) ×4 IMPLANT
PAD OB MATERNITY 4.3X12.25 (PERSONAL CARE ITEMS) ×4 IMPLANT
PAD PREP 24X41 OB/GYN DISP (PERSONAL CARE ITEMS) ×4 IMPLANT
SCISSORS METZENBAUM CVD 33 (INSTRUMENTS) ×4 IMPLANT
SHEARS HARMONIC ACE PLUS 36CM (ENDOMECHANICALS) ×8 IMPLANT
SPONGE VERSALON 2X2 STRL (MISCELLANEOUS) ×3
SPONGE XRAY 4X4 16PLY STRL (MISCELLANEOUS) IMPLANT
STAPLER SKIN PROX 35W (STAPLE) IMPLANT
STRIP CLOSURE SKIN 1/4X4 (GAUZE/BANDAGES/DRESSINGS) ×4 IMPLANT
SUT VIC AB 0 CT1 27 (SUTURE)
SUT VIC AB 0 CT1 27XCR 8 STRN (SUTURE) IMPLANT
SUT VIC AB 0 CT1 36 (SUTURE) ×4 IMPLANT
SUT VIC AB 2-0 CT1 (SUTURE) IMPLANT
SUT VIC AB 2-0 SH 27 (SUTURE)
SUT VIC AB 2-0 SH 27XBRD (SUTURE) IMPLANT
SUT VIC AB 2-0 UR6 27 (SUTURE) ×4 IMPLANT
SUT VIC AB 4-0 FS2 27 (SUTURE) ×4 IMPLANT
SUT VIC AB 4-0 RB1 18 (SUTURE) ×4 IMPLANT
SUT VIC AB 4-0 SH 27 (SUTURE) ×1
SUT VIC AB 4-0 SH 27XANBCTRL (SUTURE) ×3 IMPLANT
SUT VICRYL PLUS ABS 0 54 (SUTURE) IMPLANT
SWABSTK COMLB BENZOIN TINCTURE (MISCELLANEOUS) IMPLANT
SYR BULB IRRIG 60ML STRL (SYRINGE) IMPLANT
SYRINGE 10CC LL (SYRINGE) ×4 IMPLANT
TRAY PREP VAG/GEN (MISCELLANEOUS) ×4 IMPLANT
TROCAR ENDO BLADELESS 11MM (ENDOMECHANICALS) ×4 IMPLANT
TROCAR XCEL NON-BLD 5MMX100MML (ENDOMECHANICALS) ×4 IMPLANT
TROCAR XCEL UNIV SLVE 11M 100M (ENDOMECHANICALS) ×4 IMPLANT
TUBING INSUFFLATOR HI FLOW (MISCELLANEOUS) ×4 IMPLANT
WATER STERILE IRR 1000ML POUR (IV SOLUTION) IMPLANT

## 2016-05-05 NOTE — Anesthesia Postprocedure Evaluation (Signed)
Anesthesia Post Note  Patient: BRYLA SHELLITO  Procedure(s) Performed: Procedure(s) (LRB): LAPAROSCOPIC EXCISION RIGHT ADNEXAL CYST (Right) LAPAROSCOPIC LYSIS OF ADHESIONS, EXTENSIVE PELVIC AND ABDOMINAL LAPAROSCOPIC LYSIS OF ADHESIONS CYSTOSCOPY LAPAROSCOPIC SALPINGO OOPHORECTOMY (Left)  Patient location during evaluation: PACU Anesthesia Type: General Level of consciousness: awake and alert Pain management: pain level controlled Vital Signs Assessment: post-procedure vital signs reviewed and stable Respiratory status: spontaneous breathing, nonlabored ventilation, respiratory function stable and patient connected to nasal cannula oxygen Cardiovascular status: blood pressure returned to baseline and stable Postop Assessment: no signs of nausea or vomiting Anesthetic complications: no    Last Vitals:  Vitals:   05/05/16 1254 05/05/16 1314  BP: 140/81 133/64  Pulse: (!) 120 (!) 110  Resp:  18  Temp: 36.6 C 37.1 C    Last Pain:  Vitals:   05/05/16 1314  TempSrc: Oral  PainSc:                  Precious Haws Roylee Chaffin

## 2016-05-05 NOTE — Discharge Summary (Signed)
Physician Discharge Summary  Patient ID: Tara Young MRN: [id number] DOB/AGE: Jul 28, 1965 50 y.o.  Admit date: 05/05/2016 Discharge date: 05/06/16 Admission Diagnoses:right pelvic pain , right adnexal cyst  Discharge Diagnoses:  L/S adhesiolysis extensive  LSO Right adnexal cystectomy   Discharged Condition: good  Hospital Course: pt underwent a detailed l/s procedure with adhesiolysis including small bowel and colon . intraop consultaion with Gen Surgery . LSO and large right adnexal cystectomy  [name] at end of case    Consults: intraop Dr Azalee Course , gen surg  Significant Diagnostic Studies: labs:  Results for orders placed or performed during the hospital encounter of 05/05/16 (from the past 24 hour(s))  CBC     Status: None   Collection Time: 05/06/16  6:26 AM  Result Value Ref Range   WBC 8.8 3.6 - 11.0 K/uL   RBC 4.21 3.80 - 5.20 MIL/uL   Hemoglobin 12.8 12.0 - 16.0 g/dL   HCT 37.1 35.0 - 47.0 %   MCV 88.1 80.0 - 100.0 fL   MCH 30.4 26.0 - 34.0 pg   MCHC 34.6 32.0 - 36.0 g/dL   RDW 13.7 11.5 - 14.5 %   Platelets 261 150 - 440 K/uL     Discharge Exam: Blood pressure (!) 104/55, pulse 80, temperature 98.3 F (36.8 C), temperature source Oral, resp. rate 20, height 5\' 6"  (1.676 m), weight 82.6 kg (182 lb), SpO2 93 %. General appearance: alert and cooperative Head: atraumatic Resp: clear to auscultation bilaterally Cardio: regular rate and rhythm, S1, S2 normal, no murmur, click, rub or gallop GI: incisional TTP , non distended  Disposition: 01-Home or Self Care  Discharge Instructions    Diet - low sodium heart healthy    Complete by:  As directed    Increase activity slowly    Complete by:  As directed      d/c meds Percocet 5/325 1-2 po q 4-6 hr prn pain                   Naprosyn 500 mg po bid                   zofran 8 mg q 6 hr prn N/V                  Colace  100 mg BID   Follow-up Information    SCHERMERHORN,THOMAS, MD Follow up in 2  week(s).   Specialty:  Obstetrics and Gynecology Why:  or when discussed  Contact information: [address] [place] [address] [phone]           Signed: [name] Loh Rami Budhu 05/06/2016, 7:29 AM

## 2016-05-05 NOTE — Brief Op Note (Signed)
05/05/2016  11:53 AM  PATIENT:  Tara Young  50 y.o. female  PRE-OPERATIVE DIAGNOSIS: right  Adnexal Cyst  POST-OPERATIVE DIAGNOSIS: extensive pelvic and abdominal adhesions, complex left ovary  Large right adnexal cyst -simple  PROCEDURE:  Procedure(s): LAPAROSCOPIC EXCISION RIGHT ADNEXAL CYST (Right) LAPAROSCOPIC LYSIS OF ADHESIONS, EXTENSIVE PELVIC AND ABDOMINAL LAPAROSCOPIC LYSIS OF ADHESIONS CYSTOSCOPY LAPAROSCOPIC SALPINGO OOPHORECTOMY (Left)  SURGEON:  Surgeon(s) and Role: Panel 1:    * Boykin Nearing, MD - Primary    * Hubbard Robinson, MD - Assisting    * Benjaman Kindler, MD - Assisting  Panel 2:    * Hubbard Robinson, MD - Primary gen surgery consulting   PHYSICIAN ASSISTANT:   ASSISTANTS: none   ANESTHESIA:   general  EBL:  Total I/O In: 1450 [I.V.:1450] Out: 600 [Urine:500; Blood:100]  BLOOD ADMINISTERED:none  DRAINS: Urinary Catheter (Foley)   LOCAL MEDICATIONS USED:  MARCAINE     SPECIMEN:  Source of Specimen:  left tube and ovary , right cystectomy   DISPOSITION OF SPECIMEN:  PATHOLOGY  COUNTS:  YES  TOURNIQUET:  * No tourniquets in log *  DICTATION: .Other Dictation: Dictation Number verbal  PLAN OF CARE: observation  PATIENT DISPOSITION:  PACU - hemodynamically stable.   Delay start of Pharmacological VTE agent (>24hrs) due to surgical blood loss or risk of bleeding: not applicable

## 2016-05-05 NOTE — Anesthesia Procedure Notes (Signed)
Procedure Name: Intubation Date/Time: 05/05/2016 7:49 AM Performed by: Allean Found Pre-anesthesia Checklist: Patient identified, Emergency Drugs available, Patient being monitored, Timeout performed and Suction available Patient Re-evaluated:Patient Re-evaluated prior to inductionPreoxygenation: Pre-oxygenation with 100% oxygen Intubation Type: IV induction Ventilation: Mask ventilation without difficulty Laryngoscope Size: Mac and 3 Grade View: Grade I Tube type: Oral Tube size: 7.0 mm Number of attempts: 1 Airway Equipment and Method: Stylet Placement Confirmation: ETT inserted through vocal cords under direct vision,  positive ETCO2 and breath sounds checked- equal and bilateral Secured at: 21 cm Tube secured with: Tape Dental Injury: Teeth and Oropharynx as per pre-operative assessment

## 2016-05-05 NOTE — Transfer of Care (Signed)
Immediate Anesthesia Transfer of Care Note  Patient: Tara Young  Procedure(s) Performed: Procedure(s): LAPAROSCOPIC EXCISION RIGHT ADNEXAL CYST (Right) LAPAROSCOPIC LYSIS OF ADHESIONS, EXTENSIVE PELVIC AND ABDOMINAL LAPAROSCOPIC LYSIS OF ADHESIONS CYSTOSCOPY LAPAROSCOPIC SALPINGO OOPHORECTOMY (Left)  Patient Location: PACU  Anesthesia Type:General  Level of Consciousness: awake  Airway & Oxygen Therapy: Patient Spontanous Breathing and Patient connected to face mask oxygen  Post-op Assessment: Report given to RN and Post -op Vital signs reviewed and stable  Post vital signs: Reviewed and stable  Last Vitals:  Vitals:   05/05/16 0626 05/05/16 1215  BP: 131/86 (!) (P) 147/66  Pulse: 99   Resp: 17   Temp: 36.7 C (P) 36.9 C  pulse :115 resp:14  Last Pain:  Vitals:   05/05/16 1215  TempSrc:   PainSc: (P) 6          Complications: No apparent anesthesia complications

## 2016-05-05 NOTE — Progress Notes (Signed)
Pt ready for surgery . NPO . All questions answered .

## 2016-05-05 NOTE — Progress Notes (Signed)
Patient ID: Tara Young, female   DOB: 03-Jul-1965, 50 y.o.   MRN: [id number] DOS from L/S extensive adhesiolysis+ LSO + large Rt ovarian cystectomy  Pt doing well tonight . Eating reg food with N/V . Pain increasing tonight  O: VSS Good urine output now foley out  ABd : non distended  A: DOS , stable  P: will keep overnight for better pain control  Add toradol 30 mg IV q 8 hrs  Cont Po percocet  CBC in am  D/C home in am  Precautions to RTC before her routine 2 week appt . O/w I will see her in 2 weeks

## 2016-05-05 NOTE — Op Note (Signed)
Laparoscopic lysis of Adhesions Procedure Note  Indications: 50 year old female  the right adnexal cyst and right lower quadrant pain here for diagnostic laparoscopy with Dr. Ouida Sills. I was called into the operating room by Dr. Ouida Sills for adhesion of the small bowel to the right lower quadrant as well as part of the cecum and omentum.  Pre-operative Diagnosis: Right adnexal mass  Post-operative Diagnosis: 1. Right adnexal mass 2. Left adenexal mass 3. Adhesion of omentum, small bowel and colon  Surgeon: Hubbard Robinson   Assistants: Dr. Ouida Sills and Dr. Leafy Ro  Anesthesia: General endotracheal anesthesia  ASA Class: 2  Procedure Details  I was called into the operating room by Dr. Ouida Sills after the case of been started and the patient was already intubated and in lithotomy position with 3 trocar sites placed please see his dictation for the full details.  Upon inspection of the abdomen with the laparoscope a loop of small bowel was adhesed to the abdominal wall as well as the cecum and some omentum in the right lower quadrant. Additionally the colon was adhesed to the left adnexal mass as well. I used the laparoscopic shears to carefully dissect the small bowel off of the peritoneum and a small 1 cm area was so densely adhesed to the peritoneum that the peritoneum in this area was taken down. The bowel was inspected and there were no serosal tears or injuries to this portion of the bowel. The area where the peritoneum was taken down had some venous ooze which was stopped using a harmonic scalpel. The cecum and the omentum were also taken down and the anterior abdominal wall using sharp dissection with the laparoscopic shears.    Attention was then turned to the left lower quadrant where the colon and mesentery was densely adhesed to the left adnexal mass. Careful dissection was performed using the harmonic scalpel staying close to the left adnexal mass to gently dissect  the mesentery and colon away from the left adnexa. Once this was performed the bowel was free and no longer adhesed to the left adnexa.   The remainder of the abdomen was inspected and there were no adhesions along the liver liver looked normal there is a gallbladder that appeared normal as well. All other bowel and colon appeared normal. The small bowel intermittent taken down was inspected again with no injury seen and a small amount of peritoneum attached to the small bowel. Patient tolerated this portion of the procedure well and I turned the surgery back over to Dr. Ouida Sills and Leafy Ro.    Findings: Densely adhesive small bowel in the right lower quadrant as well as omentum and cecum; densely adhered colon to the left adnexa  Estimated Blood Loss:  Minimal         Drains: none         Total IV Fluids: 1063ml         Specimens: none         Implants: none         Complications:  None; patient tolerated the procedure well.         Condition: stable

## 2016-05-05 NOTE — Anesthesia Preprocedure Evaluation (Signed)
Anesthesia Evaluation  Patient identified by MRN, date of birth, ID band Patient awake    Reviewed: Allergy & Precautions, H&P , NPO status , Patient's Chart, lab work & pertinent test results  History of Anesthesia Complications (+) PONV and history of anesthetic complications  Airway Mallampati: III  TM Distance: >3 FB Neck ROM: full    Dental  (+) Poor Dentition   Pulmonary neg shortness of breath, pneumonia, resolved,    Pulmonary exam normal breath sounds clear to auscultation       Cardiovascular Exercise Tolerance: Good (-) angina(-) Past MI and (-) DOE negative cardio ROS Normal cardiovascular exam Rhythm:regular Rate:Normal     Neuro/Psych negative neurological ROS  negative psych ROS   GI/Hepatic Neg liver ROS, GERD  Medicated,  Endo/Other  negative endocrine ROS  Renal/GU      Musculoskeletal  (+) Arthritis ,   Abdominal   Peds  Hematology negative hematology ROS (+)   Anesthesia Other Findings Past Medical History: No date: DDD (degenerative disc disease), cervical No date: GERD (gastroesophageal reflux disease) No date: PNA (pneumonia) No date: PONV (postoperative nausea and vomiting)  Past Surgical History: No date: ABDOMINAL HYSTERECTOMY 04/24/2016: COLONOSCOPY WITH PROPOFOL N/A     Comment: Procedure: COLONOSCOPY WITH PROPOFOL;                Surgeon: Manya Silvas, MD;  Location: [facility]               ENDOSCOPY;  Service: Endoscopy;  Laterality:               N/A; 04/24/2016: ESOPHAGOGASTRODUODENOSCOPY (EGD) WITH PROPOFOL N/A     Comment: Procedure: ESOPHAGOGASTRODUODENOSCOPY (EGD)               WITH PROPOFOL;  Surgeon: Manya Silvas, MD;               Location[facility] ENDOSCOPY;  Service: Endoscopy;               Laterality: N/A; No date: RIGHT OOPHORECTOMY No date: TUBAL LIGATION  BMI    Body Mass Index:  29.38 kg/m      Reproductive/Obstetrics negative OB ROS                              Anesthesia Physical Anesthesia Plan  ASA: III  Anesthesia Plan: General ETT   Post-op Pain Management:    Induction:   Airway Management Planned:   Additional Equipment:   Intra-op Plan:   Post-operative Plan:   Informed Consent: I have reviewed the patients History and Physical, chart, labs and discussed the procedure including the risks, benefits and alternatives for the proposed anesthesia with the patient or authorized representative who has indicated his/her understanding and acceptance.   Dental Advisory Given  Plan Discussed with: Anesthesiologist, CRNA and Surgeon  Anesthesia Plan Comments:         Anesthesia Quick Evaluation

## 2016-05-06 DIAGNOSIS — D287 Benign neoplasm of other specified female genital organs: Secondary | ICD-10-CM | POA: Diagnosis not present

## 2016-05-06 LAB — CBC
HCT: 37.1 % (ref 35.0–47.0)
Hemoglobin: 12.8 g/dL (ref 12.0–16.0)
MCH: 30.4 pg (ref 26.0–34.0)
MCHC: 34.6 g/dL (ref 32.0–36.0)
MCV: 88.1 fL (ref 80.0–100.0)
PLATELETS: 261 10*3/uL (ref 150–440)
RBC: 4.21 MIL/uL (ref 3.80–5.20)
RDW: 13.7 % (ref 11.5–14.5)
WBC: 8.8 10*3/uL (ref 3.6–11.0)

## 2016-05-06 MED ORDER — OXYCODONE-ACETAMINOPHEN 5-325 MG PO TABS: 1.0000 | ORAL_TABLET | ORAL | 0 refills | Status: DC | PRN

## 2016-05-06 NOTE — Discharge Instructions (Signed)
Discharge instructions:  Call office if you have any of the following: fever >101 F, chills, excessive vaginal bleeding, incision drainage or problems, leg pain or redness, or any other concerns.   Activity: Do not lift > 10 lbs for 6 weeks.   No driving for 1-2 weeks.   Please don't limit yourself in terms of routine activity.  You will be able to do most things, although they may take longer to do or be a little painful.  You can do it!   Don't be a hero, but don't be a wimp either!

## 2016-05-06 NOTE — Progress Notes (Signed)
Discharge instructions complete. Prescriptions given to patient yesterday by doctor. Patient verbalizes understanding of teaching. Patient discharged home at 1045.

## 2016-05-06 NOTE — Op Note (Signed)
NAMEZERAH, MAHESHWARI NO.:  [id number]  MEDICAL RECORD NO.:  [id number]  LOCATION:  348A                         FACILITY:  [facility]  PHYSICIAN:  Laverta Baltimore, MDDATE OF BIRTH:  1966-02-20  DATE OF PROCEDURE: DATE OF DISCHARGE:                              OPERATIVE REPORT   PREOPERATIVE DIAGNOSIS: 1. Chronic right pelvic pain. 2. Right adnexal mass 13 x 8 cm.  POSTOPERATIVE DIAGNOSIS: 1. Chronic right pelvic pain. 2. Simple right adnexal cyst. 3. Extensive abdominal and pelvic adhesions. 4. Complex left cystic ovary.  PROCEDURE PERFORMED: 1. Laparoscopic pelvic and abdominal adhesiolysis, extensive     incorporating 90% of total operating time. 2. Left salpingo-oophorectomy. 3. Right adnexal cystectomy. 4. Cystoscopy.  SURGEON:  Laverta Baltimore, MD  ANESTHESIA:  General endotracheal anesthesia.  SURGEON:  Laverta Baltimore, MD.  FIRST ASSISTANT:  Benjaman Kindler, MD.  INTRAOPERATIVE CONSULTANT:  Dr. Susa Griffins, General Surgery.  INDICATIONS:  This is a 50 year old female, gravida 2, para 2 patient with a long history of right lower quadrant pain and right upper quadrant pain.  The patient is status post a right salpingo-oophorectomy by Dr. Ferne Reus in 2009.  Ultrasound done in the office showed a large 13 x 8 cm hypoechoic mass consistent with an ovarian mass.  Normal CA- 125.  DESCRIPTION OF PROCEDURE:  After adequate general endotracheal anesthesia, the patient was placed in dorsal supine position, legs in the [place] stirrups.  The patient's abdomen, perineum, and vagina were prepped and draped in normal sterile fashion.  A time-out was performed. A sponge stick was placed into the vaginal vault to be used for cuff manipulation given the patient has had a prior hysterectomy.  A Foley catheter placed into the bladder, yielded clear urine.  Gloves were changed, and a 15 mm infraumbilical incision was made after injecting with  0.5% Marcaine.  #11 trocar was used to advance laparoscope into the abdominal cavity under direct visualization.  Once gaining entrance, the patient's abdomen was insufflated with carbon dioxide of initial impression significant scar tissue of the small bowel to the anterior abdominal wall, right of the midline in midportion of the abdomen. There was the large blue-domed adnexal mass in the right pelvis, and the left adnexum was only partially visualized being draped with colon in 2 separate areas.  Second port site was placed at the midportion of the patient's abdomen.  Under direct visualization, #11 trocar was advanced into the abdominal cavity.  Third port site was placed in the right lower quadrant approximately 3 cm medial to the anterior iliac spine, and under direct visualization, a 5 mm port was advanced.  Initially, surgeons directed their attention to adhesiolysis in the left lower pelvis with the colon being adhesed to the left sidewall.  These adhesions were taken down with the Harmonic scalpel.  The bowel then looped onto the left adnexum and continued onto the colon, which was also adhesed down densely to the left adnexum  Meticulous dissection took place over the next hour and a half freeing up the left adnexum. Ultimately, the blood supply was secured and a complex nodular ovary and fallopian tube, which was presumably incorporated in the scar tissue  was removed, and we removed and placed into the abdomen to retrieve at a later point.  Of note, please reference Dr. Geoffry Paradise general surgery operative note.  Dr. Azalee Course did aid in dissecting the colon medially from the left adnexal adhesions.  Dr. Azalee Course was called into the operating room to address initially the dense adhesions of the small bowel, which were all looped and adherent to the anterior abdominal wall to the right of the midline Dr. Azalee Course meticulously dissected this loop of bowel down and was confident that there  was no injury to the small bowel.  Again, she did aid in the dissection of the left adnexum, and then, she scrubbed out.  Please reference her note.  At this point, surgeons, Dr. Ouida Sills and Dr. Leafy Ro, dissected the right adnexal mass.  Given the adherence of this mass from the pelvic brim to the deep cul-de-sac, the cyst was initially punctured and drained, and then, the cyst was opened and the cyst wall was then teased from the residual adnexal wall.  This was accomplished with meticulous traction and countertraction.  A large portion of the cyst was dissected with a Harmonic Scalpel as well.  Approximately 90% of the cyst wall was removed in this process, i.e., there is potential that a small amount of cyst wall was left behind.  The patient's abdomen was then copiously irrigated, and good hemostasis was noted.  Of note, out of the total operating time of 3+ hours, it was estimated that 90% of the operating time incorporated extensive adhesiolysis.  Of that 90% of operating time for adhesiolysis, Dr. Ouida Sills and Dr. Leafy Ro performed 75% of the adhesiolysis.  At this point, the anterior abdominal pressure was lowered and hemostasis was assured.  Arista was then brought up and placed over the residual left adnexal pedicle and the base of the right adnexal cyst.  The left fallopian tube and ovary were removed through the left lower port site with an endobag.  The left lower port site fascia was then closed with 2-0 Vicryl suture utilizing the [employer] cone.  The infraumbilical fascia was closed with interrupted 2- 0 Vicryl suture as well and as well as the 5 mm fascial site was closed with 2-0 Vicryl.  An intraop cystoscopy was performed utilizing normal saline.  Intravenous fluorescein was administered, and bilateral ureteral efflux of orange-tinged urine was noted, and the bladder was then drained and Foley was replaced.  All 3 abdominal incisions were closed with  interrupted subcuticular 4-0 Vicryl suture and dressed with Tegaderm.  Sponge stick was removed.  There were no complications.  ESTIMATED BLOOD LOSS:  100 mL.  INTRAOPERATIVE FLUIDS:  1800 mL.  URINE OUTPUT:  500 mL.  The patient tolerated the procedure well and was taken to recovery room in good condition.         ______________________________ Laverta Baltimore, MD    TS/MEDQ  D:  05/05/2016  T:  05/06/2016  Job:  [id number]

## 2016-05-08 DIAGNOSIS — R1013 Epigastric pain: Secondary | ICD-10-CM | POA: Insufficient documentation

## 2016-05-09 LAB — CYTOLOGY - NON PAP

## 2016-05-09 LAB — SURGICAL PATHOLOGY

## 2016-06-13 ENCOUNTER — Other Ambulatory Visit: Payer: Self-pay | Admitting: *Deleted

## 2016-06-13 DIAGNOSIS — R590 Localized enlarged lymph nodes: Secondary | ICD-10-CM

## 2016-07-13 ENCOUNTER — Ambulatory Visit: Payer: BLUE CROSS/BLUE SHIELD | Attending: Internal Medicine

## 2016-07-13 DIAGNOSIS — R590 Localized enlarged lymph nodes: Secondary | ICD-10-CM

## 2016-07-26 NOTE — Progress Notes (Signed)
[facility] Consultation       Assessment and Plan:  Mediastinal lymphadenopathy. -Calcified lymphadenopathy with scattered nodular changes seen in both lungs, greatest in the right middle lobe and lingula, these changes on the most recent scan from 08/13/15 appear improved from previous film from 1 month prior. -These changes would be most consistent with granulomatous disease, possibly sarcoidosis. Given that her symptoms have dramatically improved, I would not proceed with invasive testing at this time, but we'll continue to monitor with repeat CAT scan in approximately 1 year.  She has already had 3 CT scans in the past year, given her paucity of respiratory symptoms I would prefer to wait 1 year before repeating another CT scan.    Asthma. -Does not appear to be an issue at this time.   Pneumonia. -As above, likely due to granulomatous disease. This appears to have resolved.  Allergic Rhinitis.  -This has improved, can use flonase if needed.   Date: 07/26/2016  MRN# [id number] ASHEY TRAMONTANA Dec 16, 1965  Referring Physician: Dr. Ola Spurr.   LENIX BENOIST is a 51 y.o. old female seen in consultation for chief complaint of:    Chief Complaint  Patient presents with  . Follow-up    PFT results; no other concerns or issues:    HPI:   The patient is a 51 yo female with history mediastinal lymphadenopathy and bibasilar interstitial changes. Since her last visit here she has undergone a right adnexal ovarian cyst removal along with lysis of adhesions.   Since the surgery she notes that her breathing is better, she actually notes the difference almost immediately after the surgery. She notes no trouble breathing at this time.   She has a history of hives which has resolved.  She did allergy shots about 10 years ago and was doing very well with allergies until this past year. She also notes she has a non-first-degree relative with a diagnosis of  sarcoidosis.  **PFT: 07/14/16; FEV1 is 87% of predicted, no bronchodilator was administered. Ratio 79%. Review of lung volumes is normal, the also was 93% of predicted. Overall this test shows normal pulmonary functions. **Serum ACE 10/16/15; 38 **CT chest images reviewed from 3/24/1 and compared with previous film from 07/14/15:: Scattered tree-in-bud/nodular changes throughout the mid zones bilaterally, which are improved on the most recent films.. These appear most abundant in the right middle lobe and the lingula. Enlarged subcarinal and R4 lymph nodes, with some slight right hilar enlargement **Review of outside records and [facility]: She was seen by the infectious disease clinic on 08/02/15, she was sent for sputum, AFB, HIV, mycoplasma serology. Sputum culture showed respiratory flora, blood cultures were negative, HIV influenza negative. Mycoplasma IgM is negative, IgG was positive, indicative of previous or recent infection.   PMHX:   Past Medical History:  Diagnosis Date  . DDD (degenerative disc disease), cervical   . GERD (gastroesophageal reflux disease)   . PNA (pneumonia)   . PONV (postoperative nausea and vomiting)    Surgical Hx:  Past Surgical History:  Procedure Laterality Date  . ABDOMINAL HYSTERECTOMY    . COLONOSCOPY WITH PROPOFOL N/A 04/24/2016   Procedure: COLONOSCOPY WITH PROPOFOL;  Surgeon: Manya Silvas, MD;  Location: [facility] ENDOSCOPY;  Service: Endoscopy;  Laterality: N/A;  . CYSTOSCOPY  05/05/2016   Procedure: CYSTOSCOPY;  Surgeon: Boykin Nearing, MD;  Location: [facility] ORS;  Service: Gynecology;;  . ESOPHAGOGASTRODUODENOSCOPY (EGD) WITH PROPOFOL N/A 04/24/2016   Procedure: ESOPHAGOGASTRODUODENOSCOPY (EGD) WITH PROPOFOL;  Surgeon: Manya Silvas, MD;  Location: [facility] ENDOSCOPY;  Service: Endoscopy;  Laterality: N/A;  . LAPAROSCOPIC LYSIS OF ADHESIONS  05/05/2016   Procedure: LAPAROSCOPIC LYSIS OF ADHESIONS, EXTENSIVE PELVIC AND ABDOMINAL;  Surgeon:  Boykin Nearing, MD;  Location: [facility] ORS;  Service: Gynecology;;  . LAPAROSCOPIC LYSIS OF ADHESIONS  05/05/2016   Procedure: LAPAROSCOPIC LYSIS OF ADHESIONS;  Surgeon: Hubbard Robinson, MD;  Location: [facility] ORS;  Service: General;;  . LAPAROSCOPIC OVARIAN CYSTECTOMY Right 05/05/2016   Procedure: LAPAROSCOPIC EXCISION RIGHT ADNEXAL CYST;  Surgeon: Boykin Nearing, MD;  Location: [facility] ORS;  Service: Gynecology;  Laterality: Right;  . LAPAROSCOPIC SALPINGO OOPHERECTOMY Left 05/05/2016   Procedure: LAPAROSCOPIC SALPINGO OOPHORECTOMY;  Surgeon: Boykin Nearing, MD;  Location: [facility] ORS;  Service: Gynecology;  Laterality: Left;  . RIGHT OOPHORECTOMY    . TUBAL LIGATION     Family Hx:  Family History  Problem Relation Age of Onset  . Cancer Father    Social Hx:   Social History  Substance Use Topics  . Smoking status: Never Smoker  . Smokeless tobacco: Never Used  . Alcohol use No   Medication:   Current Outpatient Rx  . Order #: [id number] Class: Normal  . Order #: [id number] Class: Historical Med  . Order #: [id number] Class: Normal  . Order #: [id number] Class: Historical Med  . Order #: [id number] Class: Print      Allergies:  Morphine and related  Review of Systems: Gen:  Denies  fever, sweats, chills HEENT: Denies blurred vision, double vision. bleeds, Cvc:  No dizziness, chest pain. Resp:   Denies cough or sputum production,  Gi: Denies swallowing difficulty, stomach pain. Gu:  Denies bladder incontinence, burning urine Ext:   No Joint pain, stiffness. Skin: No skin rash,  hives  Endoc:  No polyuria, polydipsia. Psych: No depression, insomnia. Other:  All other systems were reviewed with the patient and were negative other that what is mentioned in the HPI.   Physical Examination:   VS: There were no vitals taken for this visit.  General Appearance: No distress  Neuro:without focal findings,  speech normal,  HEENT: PERRLA, EOM intact.   Pulmonary: normal  breath sounds, No wheezing.  CardiovascularNormal S1,S2.  No m/r/g.   Abdomen: Benign, Soft, non-tender. Renal:  No costovertebral tenderness  GU:  No performed at this time. Endoc: No evident thyromegaly, no signs of acromegaly. Skin:   warm, no rashes, no ecchymosis  Extremities: normal, no cyanosis, clubbing.  Other findings:    LABORATORY PANEL:   CBC No results for input(s): WBC, HGB, HCT, PLT in the last 168 hours. ------------------------------------------------------------------------------------------------------------------  Chemistries  No results for input(s): NA, K, CL, CO2, GLUCOSE, BUN, CREATININE, CALCIUM, MG, AST, ALT, ALKPHOS, BILITOT in the last 168 hours.  Invalid input(s): GFRCGP ------------------------------------------------------------------------------------------------------------------  Cardiac Enzymes No results for input(s): TROPONINI in the last 168 hours. ------------------------------------------------------------  RADIOLOGY:  No results found.     Thank  you for the consultation and for allowing [facility] to assist in the care of your patient. Our recommendations are noted above.  Please contact us if we can be of further service.   Marda Stalker, MD.  Board Certified in Internal Medicine, Pulmonary Medicine, [place], and Sleep Medicine.  [facility] Office Number: [phone]  Patricia Pesa, M.D.  Vilinda Boehringer, M.D.  Merton Border, M.D  07/26/2016

## 2016-07-27 ENCOUNTER — Encounter: Payer: Self-pay | Admitting: Internal Medicine

## 2016-07-27 ENCOUNTER — Ambulatory Visit (INDEPENDENT_AMBULATORY_CARE_PROVIDER_SITE_OTHER): Payer: BLUE CROSS/BLUE SHIELD | Admitting: Internal Medicine

## 2016-07-27 VITALS — BP 124/66 | HR 110 | Ht 65.5 in | Wt 181.0 lb

## 2016-07-27 DIAGNOSIS — R590 Localized enlarged lymph nodes: Secondary | ICD-10-CM

## 2016-07-27 NOTE — Addendum Note (Signed)
Addended by: Oscar La R on: 07/27/2016 11:57 AM   Modules accepted: Orders

## 2016-07-27 NOTE — Patient Instructions (Signed)
--  CT chest with contrast in about 1 year and follow up after that.

## 2016-08-07 ENCOUNTER — Telehealth: Payer: Self-pay | Admitting: *Deleted

## 2016-08-07 DIAGNOSIS — J309 Allergic rhinitis, unspecified: Secondary | ICD-10-CM | POA: Insufficient documentation

## 2016-08-07 DIAGNOSIS — K2970 Gastritis, unspecified, without bleeding: Secondary | ICD-10-CM | POA: Insufficient documentation

## 2016-08-07 DIAGNOSIS — M545 Low back pain, unspecified: Secondary | ICD-10-CM | POA: Insufficient documentation

## 2016-08-07 DIAGNOSIS — G8929 Other chronic pain: Secondary | ICD-10-CM | POA: Insufficient documentation

## 2016-08-07 NOTE — Telephone Encounter (Signed)
PreVisit Call attempted. Left VM requesting return call.

## 2016-08-08 ENCOUNTER — Ambulatory Visit (INDEPENDENT_AMBULATORY_CARE_PROVIDER_SITE_OTHER): Payer: BLUE CROSS/BLUE SHIELD | Admitting: Family Medicine

## 2016-08-08 ENCOUNTER — Encounter: Payer: Self-pay | Admitting: Family Medicine

## 2016-08-08 VITALS — BP 122/78 | HR 87 | Temp 98.1°F | Ht 66.0 in | Wt 180.2 lb

## 2016-08-08 DIAGNOSIS — Z1239 Encounter for other screening for malignant neoplasm of breast: Secondary | ICD-10-CM

## 2016-08-08 DIAGNOSIS — N951 Menopausal and female climacteric states: Secondary | ICD-10-CM | POA: Diagnosis not present

## 2016-08-08 DIAGNOSIS — F99 Mental disorder, not otherwise specified: Secondary | ICD-10-CM | POA: Diagnosis not present

## 2016-08-08 DIAGNOSIS — F331 Major depressive disorder, recurrent, moderate: Secondary | ICD-10-CM

## 2016-08-08 DIAGNOSIS — R590 Localized enlarged lymph nodes: Secondary | ICD-10-CM | POA: Insufficient documentation

## 2016-08-08 DIAGNOSIS — Z634 Disappearance and death of family member: Secondary | ICD-10-CM | POA: Diagnosis not present

## 2016-08-08 DIAGNOSIS — F4321 Adjustment disorder with depressed mood: Secondary | ICD-10-CM

## 2016-08-08 DIAGNOSIS — Z1231 Encounter for screening mammogram for malignant neoplasm of breast: Secondary | ICD-10-CM | POA: Diagnosis not present

## 2016-08-08 DIAGNOSIS — F5105 Insomnia due to other mental disorder: Secondary | ICD-10-CM

## 2016-08-08 DIAGNOSIS — N9489 Other specified conditions associated with female genital organs and menstrual cycle: Secondary | ICD-10-CM | POA: Insufficient documentation

## 2016-08-08 DIAGNOSIS — J45909 Unspecified asthma, uncomplicated: Secondary | ICD-10-CM | POA: Insufficient documentation

## 2016-08-08 MED ORDER — VENLAFAXINE HCL ER 75 MG PO CP24: 75.0000 mg | ORAL_CAPSULE | Freq: Every day | ORAL | 3 refills | Status: DC

## 2016-08-08 MED ORDER — CLONAZEPAM 0.5 MG PO TABS: 0.5000 mg | ORAL_TABLET | Freq: Every day | ORAL | 0 refills | Status: DC

## 2016-08-08 NOTE — Progress Notes (Signed)
Tara Young is a 51 y.o. female is here to [employer].   History of Present Illness:   Water quality scientist, CMA, acting as scribe for Dr. Juleen Young.  CC: Patient comes in today to establish care.  Would also like to discuss anxiety/depression since having an ovary removed in December 2017.  States since the surgery she has panic attacks and also has struggled with some depression.  Taking over the counter estrogen supplement.  Feels the anxiety/depression may be due to hormone fluctuations.  Anxiety  Presents for initial visit. Onset was 1 to 6 months ago. The problem has been gradually worsening. Symptoms include depressed mood, irritability, nervous/anxious behavior and panic. Patient reports no chest pain, dizziness, hyperventilation, insomnia, nausea, obsessions, palpitations, shortness of breath or suicidal ideas. Symptoms occur most days. The severity of symptoms is causing significant distress. The quality of sleep is poor. Nighttime awakenings: several.   Risk factors include prior traumatic experience and recent illness. Her past medical history is significant for depression. There is no history of suicide attempts. Past treatments include benzodiazephines and counseling (CBT). The treatment provided mild relief.   Health Maintenance Due  Topic Date Due  . MAMMOGRAM  06/29/2016    PMHx, SurgHx, SocialHx, Medications, and Allergies were reviewed in the Visit Navigator and updated as appropriate.   Patient Active Problem List   Diagnosis Date Noted  . Overweight (BMI 25.0-29.9) 08/09/2016  . History of small bowel obstruction 08/09/2016  . Mediastinal lymphadenopathy 08/08/2016  . Asthma 08/08/2016  . Chronic low back pain   . Allergic rhinitis   . Cervical radiculitis 03/31/2014  . Degeneration of intervertebral disc of cervical region 03/20/2014  . History of partial hysterectomy 03/20/2014  . Status post right oophorectomy 09/08/2008  . Grief at loss of child 01/12/2005    . Acne 05/01/2003   Social History  Substance Use Topics  . Smoking status: Never Smoker  . Smokeless tobacco: Never Used  . Alcohol use No   Current Medications and Allergies:   .  venlafaxine (EFFEXOR) 37.5 MG tablet, Take 37.5 mg by mouth daily., Disp: , Rfl:   Allergies  Allergen Reactions  . Morphine And Related Rash and Other (See Comments)    Review of Systems:   Review of Systems  Constitutional: Positive for irritability. Negative for chills and fever.  HENT: Negative for congestion.   Eyes: Negative for blurred vision.  Respiratory: Negative for cough and shortness of breath.   Cardiovascular: Negative for chest pain and palpitations.  Gastrointestinal: Negative for diarrhea, heartburn, nausea and vomiting.  Genitourinary: Negative for frequency.  Musculoskeletal: Negative for back pain.  Skin: Negative for rash.  Neurological: Negative for dizziness, loss of consciousness and headaches.  Psychiatric/Behavioral: Positive for depression. Negative for hallucinations, memory loss, substance abuse and suicidal ideas. The patient is nervous/anxious. The patient does not have insomnia.     Vitals:   Vitals:   08/08/16 0827  BP: 122/78  Pulse: 87  Temp: 98.1 F (36.7 C)  TempSrc: Oral  SpO2: 97%  Weight: 180 lb 3.2 oz (81.7 kg)  Height: 5\' 6"  (1.676 m)     Body mass index is 29.09 kg/m.   Physical Exam:   Physical Exam  Constitutional: She is oriented to person, place, and time. She appears well-developed and well-nourished. No distress.  HENT:  Head: Normocephalic and atraumatic.  Right Ear: External ear normal.  Left Ear: External ear normal.  Eyes: Conjunctivae and EOM are normal. Pupils are equal, round,  and reactive to light.  Neck: Normal range of motion. Neck supple. No thyromegaly present.  Cardiovascular: Normal rate, regular rhythm, normal heart sounds and intact distal pulses.   Pulmonary/Chest: Effort normal and breath sounds normal.   Abdominal: Soft.  Neurological: She is alert and oriented to person, place, and time.  Skin: Skin is warm. Capillary refill takes less than 2 seconds.  Psychiatric: She has a normal mood and affect. Her behavior is normal. Judgment and thought content normal.  Nursing note and vitals reviewed.    Assessment and Plan:   Tara Young was seen today for establish care and anxiety.  Diagnoses and all orders for this visit:  Vasomotor symptoms due to menopause Comments: Did not tolerate Rx ERT. Using Life Extension Natural Estrogen.   Moderate episode of recurrent major depressive disorder ([place]) Comments: On day 8 of Effexor. Feeling a little improvement already. Will be seeing pastoral counselor today - [facility] Orders: -     venlafaxine XR (EFFEXOR XR) 75 MG 24 hr capsule; Take 1 capsule (75 mg total) by mouth daily with breakfast.  Insomnia due to other mental disorder Comments: Using natural night calming supplement with valerian and melatonin.  Orders: -     clonazePAM (KLONOPIN) 0.5 MG tablet; Take 1 tablet (0.5 mg total) by mouth at bedtime.  Screening for breast cancer -     MM SCREENING BREAST TOMO BILATERAL; Future  Grief at loss of child Comments: Recent situational anxiety may be triggering PTSD. She will be discussing this with her counselor.   . Reviewed expectations re: course of current medical issues. . Discussed self-management of symptoms. . Outlined signs and symptoms indicating need for more acute intervention. . Patient verbalized understanding and all questions were answered. . See orders for this visit as documented in the electronic medical record. . Patient received an After Visit Summary.  Records requested if needed. I spent 30 minutes with this patient, greater than 50% was face-to-face time counseling regarding the above diagnoses.  CMA served as Education administrator during this visit. History, Physical, and Plan performed by medical provider.  Documentation and orders reviewed and attested to. Tara Young, D.O.  Tara Young, [place], [facility] 08/08/2016

## 2016-08-08 NOTE — Progress Notes (Signed)
Pre visit review using our clinic review tool, if applicable. No additional management support is needed unless otherwise documented below in the visit note. 

## 2016-08-09 DIAGNOSIS — Z8719 Personal history of other diseases of the digestive system: Secondary | ICD-10-CM | POA: Insufficient documentation

## 2016-08-09 DIAGNOSIS — E663 Overweight: Secondary | ICD-10-CM | POA: Insufficient documentation

## 2016-08-16 ENCOUNTER — Encounter: Payer: Self-pay | Admitting: Family Medicine

## 2016-08-16 DIAGNOSIS — F4310 Post-traumatic stress disorder, unspecified: Secondary | ICD-10-CM | POA: Insufficient documentation

## 2016-08-31 ENCOUNTER — Ambulatory Visit: Payer: 59 | Admitting: Family Medicine

## 2017-01-29 DIAGNOSIS — D239 Other benign neoplasm of skin, unspecified: Secondary | ICD-10-CM

## 2017-01-29 HISTORY — DX: Other benign neoplasm of skin, unspecified: D23.9

## 2017-02-06 ENCOUNTER — Ambulatory Visit (INDEPENDENT_AMBULATORY_CARE_PROVIDER_SITE_OTHER): Payer: BLUE CROSS/BLUE SHIELD | Admitting: Family Medicine

## 2017-02-06 ENCOUNTER — Encounter: Payer: Self-pay | Admitting: Family Medicine

## 2017-02-06 VITALS — BP 122/80 | HR 84 | Temp 98.1°F | Ht 66.0 in | Wt 190.0 lb

## 2017-02-06 DIAGNOSIS — Z0000 Encounter for general adult medical examination without abnormal findings: Secondary | ICD-10-CM | POA: Diagnosis not present

## 2017-02-06 DIAGNOSIS — R300 Dysuria: Secondary | ICD-10-CM | POA: Diagnosis not present

## 2017-02-06 DIAGNOSIS — Z23 Encounter for immunization: Secondary | ICD-10-CM | POA: Diagnosis not present

## 2017-02-06 LAB — POCT URINALYSIS DIPSTICK
Bilirubin, UA: NEGATIVE
Blood, UA: NEGATIVE
Ketones, UA: NEGATIVE
Leukocytes, UA: NEGATIVE
Nitrite, UA: NEGATIVE
Protein, UA: NEGATIVE
Spec Grav, UA: >=1.03 — AB (ref 1.010–1.025)
Urobilinogen, UA: 0.2 E.U./dL
pH, UA: 6 (ref 5.0–8.0)

## 2017-02-06 NOTE — Progress Notes (Signed)
Subjective:    Tara Young is a 51 y.o. female and is here for a comprehensive physical exam.  Pertinent Gynecological History: No LMP recorded. Patient has had a hysterectomy.  Health Maintenance Due  Topic Date Due  . MAMMOGRAM  06/29/2016  . INFLUENZA VACCINE  12/20/2016   PMHx, SurgHx, SocialHx, Medications, and Allergies were reviewed in the Visit Navigator and updated as appropriate.   Past Medical History:  Diagnosis Date  . Allergic rhinitis   . Anxiety   . Cervical radiculitis   . Chronic low back pain   . DDD (degenerative disc disease), cervical   . Gastritis   . GERD (gastroesophageal reflux disease)   . PNA (pneumonia)   . PONV (postoperative nausea and vomiting)    Past Surgical History:  Procedure Laterality Date  . ABDOMINAL HYSTERECTOMY    . COLONOSCOPY WITH PROPOFOL N/A 04/24/2016   Procedure: COLONOSCOPY WITH PROPOFOL;  Surgeon: Manya Silvas, MD;  Location: [facility] ENDOSCOPY;  Service: Endoscopy;  Laterality: N/A;  . CYSTOSCOPY  05/05/2016   Procedure: CYSTOSCOPY;  Surgeon: Boykin Nearing, MD;  Location: [facility] ORS;  Service: Gynecology;;  . ESOPHAGOGASTRODUODENOSCOPY (EGD) WITH PROPOFOL N/A 04/24/2016   Procedure: ESOPHAGOGASTRODUODENOSCOPY (EGD) WITH PROPOFOL;  Surgeon: Manya Silvas, MD;  Location: [facility] ENDOSCOPY;  Service: Endoscopy;  Laterality: N/A;  . LAPAROSCOPIC LYSIS OF ADHESIONS  05/05/2016   Procedure: LAPAROSCOPIC LYSIS OF ADHESIONS, EXTENSIVE PELVIC AND ABDOMINAL;  Surgeon: Boykin Nearing, MD;  Location: [facility] ORS;  Service: Gynecology;;  . LAPAROSCOPIC LYSIS OF ADHESIONS  05/05/2016   Procedure: LAPAROSCOPIC LYSIS OF ADHESIONS;  Surgeon: Hubbard Robinson, MD;  Location: [facility] ORS;  Service: General;;  . LAPAROSCOPIC OVARIAN CYSTECTOMY Right 05/05/2016   Procedure: LAPAROSCOPIC EXCISION RIGHT ADNEXAL CYST;  Surgeon: Boykin Nearing, MD;  Location: [facility] ORS;  Service: Gynecology;  Laterality: Right;  .  LAPAROSCOPIC SALPINGO OOPHERECTOMY Left 05/05/2016   Procedure: LAPAROSCOPIC SALPINGO OOPHORECTOMY;  Surgeon: Boykin Nearing, MD;  Location: [facility] ORS;  Service: Gynecology;  Laterality: Left;  . RIGHT OOPHORECTOMY    . TUBAL LIGATION     Family History  Problem Relation Age of Onset  . Cancer Father        stomach  . Lymphoma Father   . Fibromyalgia Mother   . Stroke Mother    Social History  Substance Use Topics  . Smoking status: Never Smoker  . Smokeless tobacco: Never Used  . Alcohol use No   Review of Systems:   Pertinent items are noted in the HPI. Otherwise, ROS is negative.  Objective:   BP 122/80   Pulse 84   Temp 98.1 F (36.7 C) (Oral)   Ht 5\' 6"  (1.676 m)   Wt 190 lb (86.2 kg)   SpO2 97%   BMI 30.67 kg/m   Wt Readings from Last 3 Encounters:  02/06/17 190 lb (86.2 kg)  08/08/16 180 lb 3.2 oz (81.7 kg)  07/27/16 181 lb (82.1 kg)     Ht Readings from Last 3 Encounters:  02/06/17 5\' 6"  (1.676 m)  08/08/16 5\' 6"  (1.676 m)  07/27/16 5' 5.5" (1.664 m)   General appearance: alert, cooperative and appears stated age. Head: normocephalic, without obvious abnormality, atraumatic. Neck: no adenopathy, supple, symmetrical, trachea midline; thyroid not enlarged, symmetric, no tenderness/mass/nodules. Lungs: clear to auscultation bilaterally. Heart: regular rate and rhythm Abdomen: soft, non-tender; no masses,  no organomegaly. Extremities: extremities normal, atraumatic, no cyanosis or edema. Skin: skin color, texture, turgor normal, no  rashes or lesions. Lymph: cervical, supraclavicular, and axillary nodes normal; no abnormal inguinal nodes palpated. Neurologic: grossly normal.  Results for orders placed or performed in visit on 02/06/17  POCT urinalysis dipstick  Result Value Ref Range   Color, UA Yellow    Clarity, UA Clear    Glucose, UA 1+    Bilirubin, UA Negative    Ketones, UA Negative    Spec Grav, UA >=1.030 (A) 1.010 - 1.025   Blood, UA  Negative    pH, UA 6.0 5.0 - 8.0   Protein, UA Negative    Urobilinogen, UA 0.2 0.2 or 1.0 E.U./dL   Nitrite, UA Negative    Leukocytes, UA Negative Negative   Assessment/Plan:   Tara Young was seen today for annual exam.  Diagnoses and all orders for this visit:  Dysuria -     POCT urinalysis dipstick  Routine physical examination -     CBC with Differential/Platelet; Future -     Comprehensive metabolic panel; Future -     Lipid panel; Future  Need for immunization against influenza -     Flu Vaccine QUAD 36+ mos IM   Patient Counseling: [x]    Nutrition: Stressed importance of moderation in sodium/caffeine intake, saturated fat and cholesterol, caloric balance, sufficient intake of fresh fruits, vegetables, fiber, calcium, iron, and 1 mg of folate supplement per day (for females capable of pregnancy).  [x]    Stressed the importance of regular exercise.   [x]    Substance Abuse: Discussed cessation/primary prevention of tobacco, alcohol, or other drug use; driving or other dangerous activities under the influence; availability of treatment for abuse.   [x]    Injury prevention: Discussed safety belts, safety helmets, smoke detector, smoking near bedding or upholstery.   [x]    Sexuality: Discussed sexually transmitted diseases, partner selection, use of condoms, avoidance of unintended pregnancy  and contraceptive alternatives.  [x]    Dental health: Discussed importance of regular tooth brushing, flossing, and dental visits.  [x]    Health maintenance and immunizations reviewed. Please refer to Health maintenance section.   Briscoe Deutscher, DO [place] [facility] served as Education administrator during this visit. History, Physical, and Plan performed by medical provider. The above documentation has been reviewed and is accurate and complete. Briscoe Deutscher, D.O.

## 2017-02-14 ENCOUNTER — Telehealth: Payer: Self-pay | Admitting: Family Medicine

## 2017-02-14 NOTE — Telephone Encounter (Signed)
Patient has mammo scheduled for tomorrow. She no longer needs order placed.  Ty,  -LL

## 2017-02-14 NOTE — Telephone Encounter (Signed)
Spoke with patient and she is going to [facility] for [employer]. They do not require an order.

## 2017-02-15 ENCOUNTER — Other Ambulatory Visit (INDEPENDENT_AMBULATORY_CARE_PROVIDER_SITE_OTHER): Payer: BLUE CROSS/BLUE SHIELD

## 2017-02-15 DIAGNOSIS — Z0000 Encounter for general adult medical examination without abnormal findings: Secondary | ICD-10-CM | POA: Diagnosis not present

## 2017-02-15 LAB — CBC WITH DIFFERENTIAL/PLATELET
Basophils Absolute: 0 10*3/uL (ref 0.0–0.1)
Basophils Relative: 1.1 % (ref 0.0–3.0)
Eosinophils Absolute: 0.4 10*3/uL (ref 0.0–0.7)
Eosinophils Relative: 10.3 % — ABNORMAL HIGH (ref 0.0–5.0)
HCT: 41.4 % (ref 36.0–46.0)
Hemoglobin: 13.8 g/dL (ref 12.0–15.0)
Lymphocytes Relative: 17.8 % (ref 12.0–46.0)
Lymphs Abs: 0.8 10*3/uL (ref 0.7–4.0)
MCHC: 33.4 g/dL (ref 30.0–36.0)
MCV: 88.3 fl (ref 78.0–100.0)
Monocytes Absolute: 0.3 10*3/uL (ref 0.1–1.0)
Monocytes Relative: 7.4 % (ref 3.0–12.0)
Neutro Abs: 2.8 10*3/uL (ref 1.4–7.7)
Neutrophils Relative %: 63.4 % (ref 43.0–77.0)
Platelets: 238 10*3/uL (ref 150.0–400.0)
RBC: 4.68 Mil/uL (ref 3.87–5.11)
RDW: 13.7 % (ref 11.5–15.5)
WBC: 4.3 10*3/uL (ref 4.0–10.5)

## 2017-02-15 LAB — LIPID PANEL
Cholesterol: 165 mg/dL (ref 0–200)
HDL: 41.3 mg/dL (ref >39.00)
LDL Cholesterol: 84 mg/dL (ref 0–99)
NonHDL: 123.23
Total CHOL/HDL Ratio: 4
Triglycerides: 197 mg/dL — ABNORMAL HIGH (ref 0.0–149.0)
VLDL: 39.4 mg/dL (ref 0.0–40.0)

## 2017-02-15 LAB — COMPREHENSIVE METABOLIC PANEL
ALT: 20 U/L (ref 0–35)
AST: 14 U/L (ref 0–37)
Albumin: 4.2 g/dL (ref 3.5–5.2)
Alkaline Phosphatase: 83 U/L (ref 39–117)
BUN: 14 mg/dL (ref 6–23)
CO2: 33 mEq/L — ABNORMAL HIGH (ref 19–32)
Calcium: 9.3 mg/dL (ref 8.4–10.5)
Chloride: 103 mEq/L (ref 96–112)
Creatinine, Ser: 0.81 mg/dL (ref 0.40–1.20)
GFR: 79.12 mL/min (ref >60.00)
Glucose, Bld: 102 mg/dL — ABNORMAL HIGH (ref 70–99)
Potassium: 4.5 mEq/L (ref 3.5–5.1)
Sodium: 140 mEq/L (ref 135–145)
Total Bilirubin: 0.4 mg/dL (ref 0.2–1.2)
Total Protein: 6.6 g/dL (ref 6.0–8.3)

## 2017-02-16 LAB — HM MAMMOGRAPHY

## 2017-12-12 DIAGNOSIS — F330 Major depressive disorder, recurrent, mild: Secondary | ICD-10-CM | POA: Insufficient documentation

## 2017-12-12 DIAGNOSIS — F419 Anxiety disorder, unspecified: Secondary | ICD-10-CM | POA: Insufficient documentation

## 2017-12-12 DIAGNOSIS — G4700 Insomnia, unspecified: Secondary | ICD-10-CM | POA: Insufficient documentation

## 2019-01-01 ENCOUNTER — Encounter: Payer: Self-pay | Admitting: Emergency Medicine

## 2019-01-01 ENCOUNTER — Ambulatory Visit (INDEPENDENT_AMBULATORY_CARE_PROVIDER_SITE_OTHER): Payer: Managed Care, Other (non HMO)

## 2019-01-01 ENCOUNTER — Other Ambulatory Visit: Payer: Self-pay

## 2019-01-01 ENCOUNTER — Ambulatory Visit
Admission: EM | Admit: 2019-01-01 | Discharge: 2019-01-01 | Disposition: A | Discharge status: Home / Self Care | Payer: Managed Care, Other (non HMO) | Attending: Emergency Medicine | Admitting: Emergency Medicine

## 2019-01-01 DIAGNOSIS — M545 Low back pain, unspecified: Secondary | ICD-10-CM

## 2019-01-01 LAB — URINALYSIS, COMPLETE (UACMP) WITH MICROSCOPIC
Bilirubin Urine: NEGATIVE
Glucose, UA: 500 mg/dL — AB
Hgb urine dipstick: NEGATIVE
Leukocytes,Ua: NEGATIVE
Nitrite: NEGATIVE
Protein, ur: NEGATIVE mg/dL
RBC / HPF: NONE SEEN RBC/hpf (ref 0–5)
Specific Gravity, Urine: 1.025 (ref 1.005–1.030)
pH: 6 (ref 5.0–8.0)

## 2019-01-01 MED ORDER — CYCLOBENZAPRINE HCL 10 MG PO TABS: 10.0000 mg | ORAL_TABLET | Freq: Two times a day (BID) | ORAL | 0 refills | Status: DC | PRN

## 2019-01-01 MED ORDER — PREDNISONE 10 MG PO TABS: ORAL_TABLET | ORAL | 0 refills | Status: DC

## 2019-01-01 NOTE — Discharge Instructions (Addendum)
Take medication as prescribed. Rest. Drink plenty of fluids. Monitor.  ° °Follow up with your primary care physician this week as needed. Return to Urgent care for new or worsening concerns.  ° °

## 2019-01-01 NOTE — ED Provider Notes (Signed)
[facility] ____________________________________________  Time seen: Approximately 3:46 PM  I have reviewed the triage vital signs and the nursing notes.   HISTORY  Chief Complaint Back Pain   HPI Tara Young is a 53 y.o. female presenting for evaluation of low back pain that started last night.  States low back pain initially was more in the middle and on the right side, now more on the left side but goes back and forth.  Denies any current atypical abdominal pain.  States she has chronic abdominal pain from scar tissue but denies any changes to her abdominal pain.  States pain is worse with particular movements.  Denies fall, injury or known trauma.  Does have her history of some intermittent back discomfort as well as cervical neck issues.  Denies pain radiation, paresthesias, urinary or bowel retention or incontinence.  Denies chest pain, shortness of breath, abdominal pain.  Reports otherwise doing well.  Briscoe Deutscher, DO: PCP   Past Medical History:  Diagnosis Date   Allergic rhinitis    Anxiety    Cervical radiculitis    Chronic low back pain    DDD (degenerative disc disease), cervical    Gastritis    GERD (gastroesophageal reflux disease)    PNA (pneumonia)    PONV (postoperative nausea and vomiting)     Patient Active Problem List   Diagnosis Date Noted   Major depressive disorder, recurrent, mild ([place]) 12/12/2017   Anxiety disorder 12/12/2017   Insomnia 12/12/2017   PTSD (post-traumatic stress disorder) 08/16/2016   Overweight (BMI 25.0-29.9) 08/09/2016   History of small bowel obstruction 08/09/2016   Mediastinal lymphadenopathy 08/08/2016   Asthma 08/08/2016   Chronic low back pain    Allergic rhinitis    Cervical radiculitis 03/31/2014   Degeneration of intervertebral disc of cervical region 03/20/2014   History of partial hysterectomy 03/20/2014   Status post right oophorectomy 09/08/2008   Grief at loss  of child 01/12/2005   Acne 05/01/2003    Past Surgical History:  Procedure Laterality Date   ABDOMINAL HYSTERECTOMY     COLONOSCOPY WITH PROPOFOL N/A 04/24/2016   Procedure: COLONOSCOPY WITH PROPOFOL;  Surgeon: Manya Silvas, MD;  Location: [place];  Service: Endoscopy;  Laterality: N/A;   CYSTOSCOPY  05/05/2016   Procedure: CYSTOSCOPY;  Surgeon: Boykin Nearing, MD;  Location: [facility];  Service: Gynecology;;   ESOPHAGOGASTRODUODENOSCOPY (EGD) WITH PROPOFOL N/A 04/24/2016   Procedure: ESOPHAGOGASTRODUODENOSCOPY (EGD) WITH PROPOFOL;  Surgeon: Manya Silvas, MD;  Location: [place];  Service: Endoscopy;  Laterality: N/A;   LAPAROSCOPIC LYSIS OF ADHESIONS  05/05/2016   Procedure: LAPAROSCOPIC LYSIS OF ADHESIONS, EXTENSIVE PELVIC AND ABDOMINAL;  Surgeon: Boykin Nearing, MD;  Location: [facility];  Service: Gynecology;;   LAPAROSCOPIC LYSIS OF ADHESIONS  05/05/2016   Procedure: LAPAROSCOPIC LYSIS OF ADHESIONS;  Surgeon: Hubbard Robinson, MD;  Location: [facility];  Service: General;;   LAPAROSCOPIC OVARIAN CYSTECTOMY Right 05/05/2016   Procedure: LAPAROSCOPIC EXCISION RIGHT ADNEXAL CYST;  Surgeon: Boykin Nearing, MD;  Location: [facility];  Service: Gynecology;  Laterality: Right;   LAPAROSCOPIC SALPINGO OOPHERECTOMY Left 05/05/2016   Procedure: LAPAROSCOPIC SALPINGO OOPHORECTOMY;  Surgeon: Boykin Nearing, MD;  Location: [facility];  Service: Gynecology;  Laterality: Left;   RIGHT OOPHORECTOMY     TUBAL LIGATION       No current facility-administered medications for this encounter.   Current Outpatient Medications:    citalopram (CELEXA) 20 MG tablet, Take 20 mg by mouth daily.,  Disp: , Rfl:    traZODone (DESYREL) 50 MG tablet, Take 25-50 mg by mouth at bedtime., Disp: , Rfl:    cyclobenzaprine (FLEXERIL) 10 MG tablet, Take 1 tablet (10 mg total) by mouth 2 (two) times daily as needed for muscle spasms. Do not drive while taking as can  cause drowsiness, Disp: 15 tablet, Rfl: 0   predniSONE (DELTASONE) 10 MG tablet, Start 60 mg po day one, then 50 mg po day two, taper by 10 mg daily until complete., Disp: 21 tablet, Rfl: 0  Allergies Morphine and related  Family History  Problem Relation Age of Onset   Cancer Father        stomach   Lymphoma Father    Fibromyalgia Mother    Stroke Mother    Cancer Mother     Social History Social History   Tobacco Use   Smoking status: Never Smoker   Smokeless tobacco: Never Used  Substance Use Topics   Alcohol use: No   Drug use: No    Review of Systems Constitutional: No fever ENT: No sore throat. Cardiovascular: Denies chest pain. Respiratory: Denies shortness of breath. Gastrointestinal: No abdominal pain.  No nausea, no vomiting.  No diarrhea.   Genitourinary: Negative for dysuria. Musculoskeletal: Positive for back pain. Skin: Negative for rash. Neurological: Negative for headaches, focal weakness or numbness.  ____________________________________________   PHYSICAL EXAM:  VITAL SIGNS: ED Triage Vitals  Enc Vitals Group     BP 01/01/19 1508 123/63     Pulse Rate 01/01/19 1508 86     Resp 01/01/19 1508 18     Temp 01/01/19 1508 98.6 F (37 C)     Temp Source 01/01/19 1508 Oral     SpO2 01/01/19 1508 98 %     Weight 01/01/19 1511 185 lb (83.9 kg)     Height 01/01/19 1511 5\' 6"  (1.676 m)     Head Circumference --      Peak Flow --      Pain Score 01/01/19 1507 7     Pain Loc --      Pain Edu? --      Excl. in [place]? --     Constitutional: Alert and oriented. Well appearing and in no acute distress. Eyes: Conjunctivae are normal.  ENT      Head: Normocephalic and atraumatic.. Cardiovascular: Normal rate, regular rhythm. Grossly normal heart sounds.  Good peripheral circulation. Respiratory: Normal respiratory effort without tachypnea nor retractions. Breath sounds are clear and equal bilaterally. No wheezes, rales,  rhonchi. Gastrointestinal: Soft and nontender. Normal Bowel sounds. No CVA tenderness. Musculoskeletal: No midline cervical or thoracic tenderness to palpation. Bilateral pedal pulses equal and easily palpated. Except: Mild to moderate tenderness to direct palpation midline lower lumbar tenderness to palpation, minimal bilateral paralumbar tenderness to palpation, no rash, mild pain with right and left rotation as well as lumbar flexion and extension, no pain with standing bilateral knee lifts, bilateral plantar flexion dorsiflexion strong and equal, no saddle anesthesia.  Changes positions quickly. Neurologic:  Normal speech and language. No gross focal neurologic deficits are appreciated. Speech is normal. No gait instability.  Skin:  Skin is warm, dry and intact. No rash noted. Psychiatric: Mood and affect are normal. Speech and behavior are normal. Patient exhibits appropriate insight and judgment   ___________________________________________   LABS (all labs ordered are listed, but only abnormal results are displayed)  Labs Reviewed  URINALYSIS, COMPLETE (UACMP) WITH MICROSCOPIC - Abnormal; Notable for the following components:  Result Value   Glucose, UA 500 (*)    Ketones, ur TRACE (*)    Bacteria, UA RARE (*)    All other components within normal limits  URINE CULTURE   ____________________________________________   RADIOLOGY  Dg Lumbar Spine Complete  Result Date: 01/01/2019 CLINICAL DATA:  Midline midline lumbar pain EXAM: LUMBAR SPINE - COMPLETE 4+ VIEW COMPARISON:  None. FINDINGS: Mild levoscoliosis of the lumbar spine. Vertebral body heights are normal. Mild degenerative changes at L1-L2, L2-L3, L4-L5 and L5-S1. IMPRESSION: Mild scoliosis and multiple level degenerative change. No acute osseous abnormality. Electronically Signed   By: Donavan Foil M.D.   On: 01/01/2019 16:29   ____________________________________________   PROCEDURES Procedures   __________________________________________   INITIAL IMPRESSION / ASSESSMENT AND PLAN / ED COURSE  Pertinent labs & imaging results that were available during my care of the patient were reviewed by me and considered in my medical decision making (see chart for details).  Well-appearing patient.  No acute distress.  Low back pain present for the last 2 days.  Patient denies any acute abdominal pain.  Urinalysis reviewed, will culture, felt less likely UTI.  Suspect musculoskeletal pain.  Lumbar x-ray as above per radiologist, mild scoliosis and multiple level degenerative change no acute osseous abnormality.  Suspect musculoskeletal strain injury.  History of gastritis.  Will treat with oral prednisone and PRN Flexeril.  Ice, heat, supportive care.  Monitor.  Discussed very strict follow-up and return parameters.Discussed indication, risks and benefits of medications with patient.  Discussed follow up with Primary care physician this week. Discussed follow up and return parameters including no resolution or any worsening concerns. Patient verbalized understanding and agreed to plan.   ____________________________________________   FINAL CLINICAL IMPRESSION(S) / ED DIAGNOSES  Final diagnoses:  Acute bilateral low back pain without sciatica     ED Discharge Orders         Ordered    predniSONE (DELTASONE) 10 MG tablet     01/01/19 1649    cyclobenzaprine (FLEXERIL) 10 MG tablet  2 times daily PRN     01/01/19 1649           Note: This dictation was prepared with Dragon dictation along with smaller phrase technology. Any transcriptional errors that result from this process are unintentional.         Marylene Land, NP 01/01/19 2005

## 2019-01-01 NOTE — ED Triage Notes (Signed)
Patient states she was having some back and abdominal pain on the right side, last night and this morning patient states the pain has gone across her lower back and into her left side as well

## 2019-01-02 LAB — URINE CULTURE

## 2019-04-10 ENCOUNTER — Other Ambulatory Visit: Payer: Self-pay

## 2019-04-10 ENCOUNTER — Ambulatory Visit
Admission: EM | Admit: 2019-04-10 | Discharge: 2019-04-10 | Disposition: A | Discharge status: Home / Self Care | Payer: Managed Care, Other (non HMO) | Attending: Family Medicine | Admitting: Family Medicine

## 2019-04-10 ENCOUNTER — Encounter: Payer: Self-pay | Admitting: Emergency Medicine

## 2019-04-10 ENCOUNTER — Ambulatory Visit (INDEPENDENT_AMBULATORY_CARE_PROVIDER_SITE_OTHER): Payer: Managed Care, Other (non HMO)

## 2019-04-10 DIAGNOSIS — J189 Pneumonia, unspecified organism: Secondary | ICD-10-CM

## 2019-04-10 DIAGNOSIS — R05 Cough: Secondary | ICD-10-CM | POA: Diagnosis not present

## 2019-04-10 MED ORDER — AZITHROMYCIN 250 MG PO TABS: ORAL_TABLET | ORAL | 0 refills | Status: DC

## 2019-04-10 MED ORDER — AMOXICILLIN-POT CLAVULANATE ER 1000-62.5 MG PO TB12: 2.0000 | ORAL_TABLET | Freq: Two times a day (BID) | ORAL | 0 refills | Status: AC

## 2019-04-10 NOTE — ED Triage Notes (Signed)
Patient in today c/o cough x 1 week and headache x 2 weeks, but getting worse.. Patient states headache started after having cataract surgery ~2 weeks ago. Patient denies fever, body aches or Covid exposure.

## 2019-04-10 NOTE — ED Provider Notes (Signed)
[facility]    CSN: [id number] Arrival date & time: 04/10/19  [id number]  History   Chief Complaint Chief Complaint  Patient presents with  . Cough  . Headache   HPI  53 year old female presents with cough and headache.  Patient reports that she has been quite symptomatic for the past week.  She reports cough which is now productive.  Associated headache.  She states that her headache seems to be getting worse.  Cough slightly improved today.  She is been taking NyQuil and using cough drops with some improvement.  Denies fever.  Denies chills.  She states that she did have 1 day of body aches.  No known Covid exposure.  No known exacerbating factors.  No other reported symptoms.  No other complaints.  PMH, Surgical Hx, Family Hx, Social History reviewed and updated as below.  Past Medical History:  Diagnosis Date  . Allergic rhinitis   . Anxiety   . Cervical radiculitis   . Chronic low back pain   . DDD (degenerative disc disease), cervical   . Gastritis   . GERD (gastroesophageal reflux disease)   . PNA (pneumonia)   . PONV (postoperative nausea and vomiting)     Patient Active Problem List   Diagnosis Date Noted  . Major depressive disorder, recurrent, mild ([place]) 12/12/2017  . Anxiety disorder 12/12/2017  . Insomnia 12/12/2017  . PTSD (post-traumatic stress disorder) 08/16/2016  . Overweight (BMI 25.0-29.9) 08/09/2016  . History of small bowel obstruction 08/09/2016  . Mediastinal lymphadenopathy 08/08/2016  . Asthma 08/08/2016  . Chronic low back pain   . Allergic rhinitis   . Cervical radiculitis 03/31/2014  . Degeneration of intervertebral disc of cervical region 03/20/2014  . History of partial hysterectomy 03/20/2014  . Status post right oophorectomy 09/08/2008  . Grief at loss of child 01/12/2005  . Acne 05/01/2003    Past Surgical History:  Procedure Laterality Date  . ABDOMINAL HYSTERECTOMY    . COLONOSCOPY WITH PROPOFOL N/A 04/24/2016   Procedure: COLONOSCOPY WITH PROPOFOL;  Surgeon: Manya Silvas, MD;  Location: [facility];  Service: Endoscopy;  Laterality: N/A;  . CYSTOSCOPY  05/05/2016   Procedure: CYSTOSCOPY;  Surgeon: Boykin Nearing, MD;  Location: [facility];  Service: Gynecology;;  . ESOPHAGOGASTRODUODENOSCOPY (EGD) WITH PROPOFOL N/A 04/24/2016   Procedure: ESOPHAGOGASTRODUODENOSCOPY (EGD) WITH PROPOFOL;  Surgeon: Manya Silvas, MD;  Location: [facility];  Service: Endoscopy;  Laterality: N/A;  . LAPAROSCOPIC LYSIS OF ADHESIONS  05/05/2016   Procedure: LAPAROSCOPIC LYSIS OF ADHESIONS, EXTENSIVE PELVIC AND ABDOMINAL;  Surgeon: Boykin Nearing, MD;  Location: [facility];  Service: Gynecology;;  . LAPAROSCOPIC LYSIS OF ADHESIONS  05/05/2016   Procedure: LAPAROSCOPIC LYSIS OF ADHESIONS;  Surgeon: Hubbard Robinson, MD;  Location: [facility];  Service: General;;  . LAPAROSCOPIC OVARIAN CYSTECTOMY Right 05/05/2016   Procedure: LAPAROSCOPIC EXCISION RIGHT ADNEXAL CYST;  Surgeon: Boykin Nearing, MD;  Location: [facility];  Service: Gynecology;  Laterality: Right;  . LAPAROSCOPIC SALPINGO OOPHERECTOMY Left 05/05/2016   Procedure: LAPAROSCOPIC SALPINGO OOPHORECTOMY;  Surgeon: Boykin Nearing, MD;  Location: [facility];  Service: Gynecology;  Laterality: Left;  . RIGHT OOPHORECTOMY    . TUBAL LIGATION      OB History   No obstetric history on file.      Home Medications    Prior to Admission medications   Medication Sig Start Date End Date Taking? Authorizing Provider  citalopram (CELEXA) 20 MG tablet Take 20 mg by mouth daily.  Yes [provider]  cyclobenzaprine (FLEXERIL) 10 MG tablet Take 1 tablet (10 mg total) by mouth 2 (two) times daily as needed for muscle spasms. Do not drive while taking as can cause drowsiness 01/01/19  Yes Marylene Land, NP  traZODone (DESYREL) 50 MG tablet Take 25-50 mg by mouth at bedtime.   Yes [provider]  amoxicillin-clavulanate  (AUGMENTIN XR) 1000-62.5 MG 12 hr tablet Take 2 tablets by mouth 2 (two) times daily for 7 days. 04/10/19 04/17/19  Coral Spikes, DO  azithromycin (ZITHROMAX) 250 MG tablet 2 tablets on day 1, then 1 tablet daily on days 2-5. 04/10/19   Coral Spikes, DO    Family History Family History  Problem Relation Age of Onset  . Cancer Father        stomach  . Lymphoma Father   . Fibromyalgia Mother   . Stroke Mother   . Cancer Mother     Social History Social History   Tobacco Use  . Smoking status: Never Smoker  . Smokeless tobacco: Never Used  Substance Use Topics  . Alcohol use: No  . Drug use: No     Allergies   Morphine and related   Review of Systems Review of Systems  Constitutional: Negative for chills and fever.  Respiratory: Positive for cough.   Neurological: Positive for headaches.   Physical Exam Triage Vital Signs ED Triage Vitals [04/10/19 0913]  Enc Vitals Group     BP 136/88     Pulse Rate 91     Resp 18     Temp 98.4 F (36.9 C)     Temp Source Oral     SpO2 97 %     Weight 180 lb (81.6 kg)     Height 5\' 7"  (1.702 m)     Head Circumference      Peak Flow      Pain Score 7     Pain Loc      Pain Edu?      Excl. in [place]?    No data found.  Updated Vital Signs BP 136/88 (BP Location: Left Arm)   Pulse 91   Temp 98.4 F (36.9 C) (Oral)   Resp 18   Ht 5\' 7"  (1.702 m)   Wt 81.6 kg   SpO2 97%   BMI 28.19 kg/m   Visual Acuity Right Eye Distance:   Left Eye Distance:   Bilateral Distance:    Right Eye Near:   Left Eye Near:    Bilateral Near:     Physical Exam Vitals signs and nursing note reviewed.  Constitutional:      General: She is not in acute distress.    Appearance: Normal appearance. She is not ill-appearing.  HENT:     Head: Normocephalic and atraumatic.  Eyes:     General:        Right eye: No discharge.        Left eye: No discharge.     Conjunctiva/sclera: Conjunctivae normal.  Cardiovascular:     Rate and  Rhythm: Normal rate and regular rhythm.     Heart sounds: No murmur.  Pulmonary:     Effort: Pulmonary effort is normal.     Breath sounds: Wheezing present.  Neurological:     Mental Status: She is alert.  Psychiatric:        Mood and Affect: Mood normal.        Behavior: Behavior normal.    UC Treatments /  Results  Labs (all labs ordered are listed, but only abnormal results are displayed) Labs Reviewed  NOVEL CORONAVIRUS, NAA (HOSPITAL ORDER, SEND-OUT TO REF LAB)    EKG   Radiology Dg Chest 2 View  Result Date: 04/10/2019 CLINICAL DATA:  Duct of cough for 2 days, history of pneumonia EXAM: CHEST - 2 VIEW COMPARISON:  07/14/2015 chest radiograph. FINDINGS: Stable cardiomediastinal silhouette with normal heart size. No pneumothorax. No pleural effusion. Patchy consolidation in the left mid lung is new. Thin linear scar in the right mid lung. IMPRESSION: New patchy left mid lung consolidation compatible with pneumonia. Post treatment chest radiograph follow-up advised. Electronically Signed   By: Ilona Sorrel M.D.   On: 04/10/2019 10:05    Procedures Procedures (including critical care time)  Medications Ordered in UC Medications - No data to display  Initial Impression / Assessment and Plan / UC Course  I have reviewed the triage vital signs and the nursing notes.  Pertinent labs & imaging results that were available during my care of the patient were reviewed by me and considered in my medical decision making (see chart for details).    53 year old female presents with community-acquired pneumonia.  See x-ray above.  Awaiting Covid test results.  Treating with Augmentin and azithromycin.  Final Clinical Impressions(s) / UC Diagnoses   Final diagnoses:  Community acquired pneumonia of left upper lobe of lung   Discharge Instructions   None    ED Prescriptions    Medication Sig Dispense Auth. Provider   amoxicillin-clavulanate (AUGMENTIN XR) 1000-62.5 MG 12 hr  tablet Take 2 tablets by mouth 2 (two) times daily for 7 days. 28 tablet Humza Tallerico G, DO   azithromycin (ZITHROMAX) 250 MG tablet 2 tablets on day 1, then 1 tablet daily on days 2-5. 6 tablet Coral Spikes, DO     PDMP not reviewed this encounter.   Coral Spikes, DO 04/10/19 1049

## 2019-04-11 LAB — NOVEL CORONAVIRUS, NAA (HOSP ORDER, SEND-OUT TO REF LAB; TAT 18-24 HRS): SARS-CoV-2, NAA: NOT DETECTED

## 2019-04-25 ENCOUNTER — Other Ambulatory Visit: Payer: Self-pay

## 2019-04-25 ENCOUNTER — Ambulatory Visit (INDEPENDENT_AMBULATORY_CARE_PROVIDER_SITE_OTHER): Payer: Managed Care, Other (non HMO)

## 2019-04-25 ENCOUNTER — Ambulatory Visit
Admission: EM | Admit: 2019-04-25 | Discharge: 2019-04-25 | Disposition: A | Discharge status: Home / Self Care | Payer: Managed Care, Other (non HMO) | Attending: Family Medicine | Admitting: Family Medicine

## 2019-04-25 ENCOUNTER — Encounter: Payer: Self-pay | Admitting: Emergency Medicine

## 2019-04-25 DIAGNOSIS — Z20828 Contact with and (suspected) exposure to other viral communicable diseases: Secondary | ICD-10-CM

## 2019-04-25 DIAGNOSIS — R05 Cough: Secondary | ICD-10-CM

## 2019-04-25 DIAGNOSIS — Z20822 Contact with and (suspected) exposure to covid-19: Secondary | ICD-10-CM

## 2019-04-25 DIAGNOSIS — R059 Cough, unspecified: Secondary | ICD-10-CM

## 2019-04-25 NOTE — ED Provider Notes (Signed)
[facility]    CSN: [id number] Arrival date & time: 04/25/19  1138      History   Chief Complaint Chief Complaint  Patient presents with  . Cough    HPI Tara Young is a 53 y.o. female.   53 yo female with a c/o improved but continuing cough. Patient seen here about 3 weeks ago and diagnosed with left lobe pneumonia by chest x-ray and treated with antibiotics which she completed. States overall she's doing better but cough continues although improved. Patient also states that this week she had a covid exposure at work. Denies any fevers, chills.    Cough   Past Medical History:  Diagnosis Date  . Allergic rhinitis   . Anxiety   . Cervical radiculitis   . Chronic low back pain   . DDD (degenerative disc disease), cervical   . Gastritis   . GERD (gastroesophageal reflux disease)   . PNA (pneumonia)   . PONV (postoperative nausea and vomiting)     Patient Active Problem List   Diagnosis Date Noted  . Major depressive disorder, recurrent, mild ([place]) 12/12/2017  . Anxiety disorder 12/12/2017  . Insomnia 12/12/2017  . PTSD (post-traumatic stress disorder) 08/16/2016  . Overweight (BMI 25.0-29.9) 08/09/2016  . History of small bowel obstruction 08/09/2016  . Mediastinal lymphadenopathy 08/08/2016  . Asthma 08/08/2016  . Chronic low back pain   . Allergic rhinitis   . Cervical radiculitis 03/31/2014  . Degeneration of intervertebral disc of cervical region 03/20/2014  . History of partial hysterectomy 03/20/2014  . Status post right oophorectomy 09/08/2008  . Grief at loss of child 01/12/2005  . Acne 05/01/2003    Past Surgical History:  Procedure Laterality Date  . ABDOMINAL HYSTERECTOMY    . COLONOSCOPY WITH PROPOFOL N/A 04/24/2016   Procedure: COLONOSCOPY WITH PROPOFOL;  Surgeon: Manya Silvas, MD;  Location: [facility] ENDOSCOPY;  Service: Endoscopy;  Laterality: N/A;  . CYSTOSCOPY  05/05/2016   Procedure: CYSTOSCOPY;  Surgeon: Boykin Nearing, MD;  Location: [facility] ORS;  Service: Gynecology;;  . ESOPHAGOGASTRODUODENOSCOPY (EGD) WITH PROPOFOL N/A 04/24/2016   Procedure: ESOPHAGOGASTRODUODENOSCOPY (EGD) WITH PROPOFOL;  Surgeon: Manya Silvas, MD;  Location: [facility] ENDOSCOPY;  Service: Endoscopy;  Laterality: N/A;  . LAPAROSCOPIC LYSIS OF ADHESIONS  05/05/2016   Procedure: LAPAROSCOPIC LYSIS OF ADHESIONS, EXTENSIVE PELVIC AND ABDOMINAL;  Surgeon: Boykin Nearing, MD;  Location: [facility] ORS;  Service: Gynecology;;  . LAPAROSCOPIC LYSIS OF ADHESIONS  05/05/2016   Procedure: LAPAROSCOPIC LYSIS OF ADHESIONS;  Surgeon: Hubbard Robinson, MD;  Location: [facility] ORS;  Service: General;;  . LAPAROSCOPIC OVARIAN CYSTECTOMY Right 05/05/2016   Procedure: LAPAROSCOPIC EXCISION RIGHT ADNEXAL CYST;  Surgeon: Boykin Nearing, MD;  Location: [facility] ORS;  Service: Gynecology;  Laterality: Right;  . LAPAROSCOPIC SALPINGO OOPHERECTOMY Left 05/05/2016   Procedure: LAPAROSCOPIC SALPINGO OOPHORECTOMY;  Surgeon: Boykin Nearing, MD;  Location: [facility] ORS;  Service: Gynecology;  Laterality: Left;  . RIGHT OOPHORECTOMY    . TUBAL LIGATION      OB History   No obstetric history on file.      Home Medications    Prior to Admission medications   Medication Sig Start Date End Date Taking? Authorizing Provider  citalopram (CELEXA) 20 MG tablet Take 20 mg by mouth daily.   Yes [provider]  traZODone (DESYREL) 50 MG tablet Take 25-50 mg by mouth at bedtime.   Yes [provider]  azithromycin (ZITHROMAX) 250 MG tablet 2 tablets on  day 1, then 1 tablet daily on days 2-5. 04/10/19   Coral Spikes, DO  cyclobenzaprine (FLEXERIL) 10 MG tablet Take 1 tablet (10 mg total) by mouth 2 (two) times daily as needed for muscle spasms. Do not drive while taking as can cause drowsiness 01/01/19   Marylene Land, NP    Family History Family History  Problem Relation Age of Onset  . Cancer Father        stomach  . Lymphoma  Father   . Fibromyalgia Mother   . Stroke Mother   . Cancer Mother     Social History Social History   Tobacco Use  . Smoking status: Never Smoker  . Smokeless tobacco: Never Used  Substance Use Topics  . Alcohol use: No  . Drug use: No     Allergies   Morphine and related   Review of Systems Review of Systems  Respiratory: Positive for cough.      Physical Exam Triage Vital Signs ED Triage Vitals  Enc Vitals Group     BP 04/25/19 1158 131/82     Pulse Rate 04/25/19 1158 87     Resp 04/25/19 1158 16     Temp 04/25/19 1158 99 F (37.2 C)     Temp Source 04/25/19 1158 Oral     SpO2 04/25/19 1158 97 %     Weight 04/25/19 1155 185 lb (83.9 kg)     Height 04/25/19 1155 5\' 6"  (1.676 m)     Head Circumference --      Peak Flow --      Pain Score 04/25/19 1155 7     Pain Loc --      Pain Edu? --      Excl. in [place]? --    No data found.  Updated Vital Signs BP 131/82 (BP Location: Right Arm)   Pulse 87   Temp 99 F (37.2 C) (Oral)   Resp 16   Ht 5\' 6"  (1.676 m)   Wt 83.9 kg   SpO2 97%   BMI 29.86 kg/m   Visual Acuity Right Eye Distance:   Left Eye Distance:   Bilateral Distance:    Right Eye Near:   Left Eye Near:    Bilateral Near:     Physical Exam Vitals signs and nursing note reviewed.  Constitutional:      General: She is not in acute distress.    Appearance: She is not toxic-appearing or diaphoretic.  Cardiovascular:     Rate and Rhythm: Normal rate.  Pulmonary:     Effort: Pulmonary effort is normal. No respiratory distress.  Neurological:     Mental Status: She is alert.      UC Treatments / Results  Labs (all labs ordered are listed, but only abnormal results are displayed) Labs Reviewed  NOVEL CORONAVIRUS, NAA (HOSP ORDER, SEND-OUT TO REF LAB; TAT 18-24 HRS)    EKG   Radiology Dg Chest 2 View  Result Date: 04/25/2019 CLINICAL DATA:  Left-sided pneumonia EXAM: CHEST - 2 VIEW COMPARISON:  04/10/2019 FINDINGS: There is  left perihilar and right middle lobe airspace disease which is improved compared with the prior examination. There is no focal consolidation. There is no pleural effusion or pneumothorax. The heart and mediastinal contours are unremarkable. There is no acute osseous abnormality. IMPRESSION: Improving CLINICAL DATA:  Left-sided pneumonia EXAM: CHEST - 2 VIEW COMPARISON:  04/10/2019, 07/14/2015 FINDINGS: There is bilateral perihilar interstitial thickening likely chronic. There is chronic nodularity along the  peripheral aspect of the left mid lung. There is no persistent focal consolidation. There is no pleural effusion or pneumothorax. The heart and mediastinal contours are unremarkable. There is no acute osseous abnormality. IMPRESSION: Interval resolution of left lower lobe pneumonia. Electronically Signed   By: Kathreen Devoid   On: 04/25/2019 12:36    Procedures Procedures (including critical care time)  Medications Ordered in UC Medications - No data to display  Initial Impression / Assessment and Plan / UC Course  I have reviewed the triage vital signs and the nursing notes.  Pertinent labs & imaging results that were available during my care of the patient were reviewed by me and considered in my medical decision making (see chart for details).      Final Clinical Impressions(s) / UC Diagnoses   Final diagnoses:  Cough  Exposure to COVID-19 virus  left lower lobe pneumonia (resolved)   Discharge Instructions     Rest, fluids, over the counter cough medication as needed    ED Prescriptions    None     1. x-ray results and diagnosis reviewed with patient 2. Recommend supportive treatment as above 3. covid test done  4. Follow-up prn if symptoms worsen or don't improve  PDMP not reviewed this encounter.   Norval Gable, MD 04/25/19 1500

## 2019-04-25 NOTE — ED Triage Notes (Signed)
Patient states that she was treated for pneumonia on 04/10/2019.  Patient states that she feels better but still has an ongoing cough.  Patient denies fevers. Patient states that she was exposed to a coworker that tested positive for COVID on Wed.

## 2019-04-25 NOTE — Discharge Instructions (Signed)
Rest, fluids, over the counter cough medication as needed

## 2019-04-28 ENCOUNTER — Telehealth (HOSPITAL_COMMUNITY): Payer: Self-pay | Admitting: Emergency Medicine

## 2019-04-28 ENCOUNTER — Encounter (HOSPITAL_COMMUNITY): Payer: Self-pay

## 2019-04-28 ENCOUNTER — Emergency Department: Payer: Managed Care, Other (non HMO)

## 2019-04-28 ENCOUNTER — Emergency Department
Admission: EM | Admit: 2019-04-28 | Discharge: 2019-04-28 | Disposition: A | Discharge status: Home / Self Care | Payer: Managed Care, Other (non HMO) | Attending: Student in an Organized Health Care Education/Training Program | Admitting: Student in an Organized Health Care Education/Training Program

## 2019-04-28 ENCOUNTER — Encounter: Payer: Self-pay | Admitting: Emergency Medicine

## 2019-04-28 DIAGNOSIS — Z79899 Other long term (current) drug therapy: Secondary | ICD-10-CM | POA: Diagnosis not present

## 2019-04-28 DIAGNOSIS — J45909 Unspecified asthma, uncomplicated: Secondary | ICD-10-CM | POA: Insufficient documentation

## 2019-04-28 DIAGNOSIS — R509 Fever, unspecified: Secondary | ICD-10-CM | POA: Insufficient documentation

## 2019-04-28 DIAGNOSIS — R071 Chest pain on breathing: Secondary | ICD-10-CM | POA: Insufficient documentation

## 2019-04-28 DIAGNOSIS — M62830 Muscle spasm of back: Secondary | ICD-10-CM | POA: Diagnosis not present

## 2019-04-28 DIAGNOSIS — R05 Cough: Secondary | ICD-10-CM | POA: Insufficient documentation

## 2019-04-28 DIAGNOSIS — M546 Pain in thoracic spine: Secondary | ICD-10-CM | POA: Diagnosis present

## 2019-04-28 DIAGNOSIS — R0782 Intercostal pain: Secondary | ICD-10-CM | POA: Diagnosis not present

## 2019-04-28 LAB — URINALYSIS, COMPLETE (UACMP) WITH MICROSCOPIC
Bacteria, UA: NONE SEEN
Bilirubin Urine: NEGATIVE
Glucose, UA: 150 mg/dL — AB
Hgb urine dipstick: NEGATIVE
Ketones, ur: NEGATIVE mg/dL
Leukocytes,Ua: NEGATIVE
Nitrite: NEGATIVE
Protein, ur: NEGATIVE mg/dL
Specific Gravity, Urine: 1.029 (ref 1.005–1.030)
pH: 5 (ref 5.0–8.0)

## 2019-04-28 MED ORDER — HYDROCODONE-ACETAMINOPHEN 5-325 MG PO TABS
1.0000 | ORAL_TABLET | ORAL | Status: AC
  Administered 2019-04-28: 1 via ORAL
  Filled 2019-04-28: qty 1

## 2019-04-28 MED ORDER — DEXAMETHASONE SODIUM PHOSPHATE 10 MG/ML IJ SOLN
10.0000 mg | Freq: Once | INTRAMUSCULAR | Status: AC
  Administered 2019-04-28: 10 mg via INTRAMUSCULAR
  Filled 2019-04-28: qty 1

## 2019-04-28 MED ORDER — ORPHENADRINE CITRATE 30 MG/ML IJ SOLN
60.0000 mg | Freq: Two times a day (BID) | INTRAMUSCULAR | Status: DC
  Administered 2019-04-28: 60 mg via INTRAMUSCULAR
  Filled 2019-04-28: qty 2

## 2019-04-28 MED ORDER — CYCLOBENZAPRINE HCL 5 MG PO TABS: 5.0000 mg | ORAL_TABLET | Freq: Three times a day (TID) | ORAL | 0 refills | Status: DC | PRN

## 2019-04-28 MED ORDER — HYDROCODONE-ACETAMINOPHEN 5-325 MG PO TABS: 1.0000 | ORAL_TABLET | ORAL | 0 refills | Status: DC | PRN

## 2019-04-28 MED ORDER — METHYLPREDNISOLONE 4 MG PO TABS: 4.0000 mg | ORAL_TABLET | Freq: Every day | ORAL | 0 refills | Status: DC

## 2019-04-28 NOTE — ED Notes (Signed)
Pt reports progressively worsening R rib and back pain that was resolving slowly after pneumonia. Pt states she sneezed today and had sudden onset of worse pain in posterior R ribs and back. Pt c/o reproducible pain w/ inspiration, cough, and position change. Pt was taking ibuprofen for pain prior to today and was receiving some relief w/ that.

## 2019-04-28 NOTE — Discharge Instructions (Addendum)
Please take medications as prescribed.  Return to the ER for any increasing pain, shortness of breath, fevers above 101, abdominal pain, worsening symptoms or urgent changes in health.

## 2019-04-28 NOTE — Telephone Encounter (Signed)
Your test for COVID-19 was positive, meaning that you were infected with the novel coronavirus and could give the germ to others.  Please continue isolation at home for at least 10 days since the start of your symptoms. If you do not have symptoms, please isolate at home for 10 days from the day you were tested. Once you complete your 10 day quarantine, you may return to normal activities as long as you've not had a fever for over 24 hours(without taking fever reducing medicine) and your symptoms are improving. Please continue good preventive care measures, including:  frequent hand-washing, avoid touching your face, cover coughs/sneezes, stay out of crowds and keep a 6 foot distance from others.  Go to the nearest hospital emergency room if fever/cough/breathlessness are severe or illness seems like a threat to life.  Attempted to reach patient. No answer at this time. Voicemail left.

## 2019-04-28 NOTE — ED Provider Notes (Signed)
[facility] EMERGENCY DEPARTMENT Provider Note   CSN: [id number] Arrival date & time: 04/28/19  1827     History   Chief Complaint Chief Complaint  Patient presents with  . Back Pain    rib pain    HPI Tara Young is a 53 y.o. female presents to the emergency department for evaluation of thoracic back and right rib pain.  Several weeks ago she was diagnosed with pneumonia, had successful treatment with Augmentin and azithromycin.  Had recent x-rays of the chest few days ago showing resolution.  Also 3 days ago she had positive diagnosis for Covid due to exposure at work.  Patient states her symptoms over the last few days have consisted of mild cold symptoms.  No chest pain or shortness of breath.  She denies any wheezing.  She has had some mild cough and sneezing.  Patient states today she had a large sneeze that caused her to have sharp right-sided rib pain and thoracic back pain.  Pain present with taking a deep breath.  Also has pain with movement.  Pain is been moderate to severe since.  She feels that pain is in the muscle.  She denies any shortness of breath or difficulty breathing.  She has had no fevers but did have a low-grade fever of 99.9 upon arrival.  No abdominal pain, dysuria.  No nausea or vomiting.    HPI  Past Medical History:  Diagnosis Date  . Allergic rhinitis   . Anxiety   . Cervical radiculitis   . Chronic low back pain   . DDD (degenerative disc disease), cervical   . Gastritis   . GERD (gastroesophageal reflux disease)   . PNA (pneumonia)   . PONV (postoperative nausea and vomiting)     Patient Active Problem List   Diagnosis Date Noted  . Major depressive disorder, recurrent, mild ([place]) 12/12/2017  . Anxiety disorder 12/12/2017  . Insomnia 12/12/2017  . PTSD (post-traumatic stress disorder) 08/16/2016  . Overweight (BMI 25.0-29.9) 08/09/2016  . History of small bowel obstruction 08/09/2016  . Mediastinal  lymphadenopathy 08/08/2016  . Asthma 08/08/2016  . Chronic low back pain   . Allergic rhinitis   . Cervical radiculitis 03/31/2014  . Degeneration of intervertebral disc of cervical region 03/20/2014  . History of partial hysterectomy 03/20/2014  . Status post right oophorectomy 09/08/2008  . Grief at loss of child 01/12/2005  . Acne 05/01/2003    Past Surgical History:  Procedure Laterality Date  . ABDOMINAL HYSTERECTOMY    . COLONOSCOPY WITH PROPOFOL N/A 04/24/2016   Procedure: COLONOSCOPY WITH PROPOFOL;  Surgeon: Manya Silvas, MD;  Location: [facility] ENDOSCOPY;  Service: Endoscopy;  Laterality: N/A;  . CYSTOSCOPY  05/05/2016   Procedure: CYSTOSCOPY;  Surgeon: Boykin Nearing, MD;  Location: [facility] ORS;  Service: Gynecology;;  . ESOPHAGOGASTRODUODENOSCOPY (EGD) WITH PROPOFOL N/A 04/24/2016   Procedure: ESOPHAGOGASTRODUODENOSCOPY (EGD) WITH PROPOFOL;  Surgeon: Manya Silvas, MD;  Location: [facility] ENDOSCOPY;  Service: Endoscopy;  Laterality: N/A;  . LAPAROSCOPIC LYSIS OF ADHESIONS  05/05/2016   Procedure: LAPAROSCOPIC LYSIS OF ADHESIONS, EXTENSIVE PELVIC AND ABDOMINAL;  Surgeon: Boykin Nearing, MD;  Location: [facility] ORS;  Service: Gynecology;;  . LAPAROSCOPIC LYSIS OF ADHESIONS  05/05/2016   Procedure: LAPAROSCOPIC LYSIS OF ADHESIONS;  Surgeon: Hubbard Robinson, MD;  Location: [facility] ORS;  Service: General;;  . LAPAROSCOPIC OVARIAN CYSTECTOMY Right 05/05/2016   Procedure: LAPAROSCOPIC EXCISION RIGHT ADNEXAL CYST;  Surgeon: Boykin Nearing, MD;  Location: [facility]  ORS;  Service: Gynecology;  Laterality: Right;  . LAPAROSCOPIC SALPINGO OOPHERECTOMY Left 05/05/2016   Procedure: LAPAROSCOPIC SALPINGO OOPHORECTOMY;  Surgeon: Boykin Nearing, MD;  Location: [facility] ORS;  Service: Gynecology;  Laterality: Left;  . RIGHT OOPHORECTOMY    . TUBAL LIGATION       OB History   No obstetric history on file.      Home Medications    Prior to Admission medications   Medication  Sig Start Date End Date Taking? Authorizing Provider  azithromycin (ZITHROMAX) 250 MG tablet 2 tablets on day 1, then 1 tablet daily on days 2-5. 04/10/19   Coral Spikes, DO  citalopram (CELEXA) 20 MG tablet Take 20 mg by mouth daily.    [provider]  cyclobenzaprine (FLEXERIL) 5 MG tablet Take 1-2 tablets (5-10 mg total) by mouth 3 (three) times daily as needed for muscle spasms. 04/28/19   Duanne Guess, PA-C  HYDROcodone-acetaminophen (NORCO) 5-325 MG tablet Take 1 tablet by mouth every 4 (four) hours as needed for moderate pain. 04/28/19   Duanne Guess, PA-C  methylPREDNISolone (MEDROL) 4 MG tablet Take 1 tablet (4 mg total) by mouth daily. 04/28/19   Duanne Guess, PA-C  traZODone (DESYREL) 50 MG tablet Take 25-50 mg by mouth at bedtime.    [provider]    Family History Family History  Problem Relation Age of Onset  . Cancer Father        stomach  . Lymphoma Father   . Fibromyalgia Mother   . Stroke Mother   . Cancer Mother     Social History Social History   Tobacco Use  . Smoking status: Never Smoker  . Smokeless tobacco: Never Used  Substance Use Topics  . Alcohol use: No  . Drug use: No     Allergies   Morphine and related   Review of Systems Review of Systems  Constitutional: Negative for fever.  Respiratory: Negative for chest tightness, shortness of breath, wheezing and stridor.   Cardiovascular: Negative for chest pain.  Gastrointestinal: Negative for abdominal pain, nausea and vomiting.  Genitourinary: Negative for dysuria.  Musculoskeletal: Positive for back pain and myalgias.  Skin: Negative for rash.     Physical Exam Updated Vital Signs BP (!) 164/63 (BP Location: Left Arm)   Pulse 99   Temp 99.9 F (37.7 C) (Oral)   Resp 16   Wt 83.9 kg   SpO2 96%   BMI 29.85 kg/m   Physical Exam Constitutional:      Appearance: She is well-developed.  HENT:     Head: Normocephalic and atraumatic.  Eyes:      Conjunctiva/sclera: Conjunctivae normal.  Neck:     Musculoskeletal: Normal range of motion.  Cardiovascular:     Rate and Rhythm: Normal rate.  Pulmonary:     Effort: Pulmonary effort is normal. No respiratory distress.     Breath sounds: Normal breath sounds. No wheezing, rhonchi or rales.  Abdominal:     General: Bowel sounds are normal. There is no distension.     Palpations: Abdomen is soft.     Tenderness: There is no abdominal tenderness.  Musculoskeletal: Normal range of motion.     Comments: Thoracic spine tenderness along the mid thoracic spine with right rib tenderness.  No ecchymosis and no palpable defect.  Soft tissues are tender along the right paravertebral muscles of thoracic spine.  Pain increased with taking a deep breath, she describes the pain as sharp.  Skin:    General: Skin is warm.     Findings: No rash.  Neurological:     Mental Status: She is alert and oriented to person, place, and time.  Psychiatric:        Behavior: Behavior normal.        Thought Content: Thought content normal.      ED Treatments / Results  Labs (all labs ordered are listed, but only abnormal results are displayed) Labs Reviewed  URINALYSIS, COMPLETE (UACMP) WITH MICROSCOPIC - Abnormal; Notable for the following components:      Result Value   Color, Urine YELLOW (*)    APPearance HAZY (*)    Glucose, UA 150 (*)    All other components within normal limits    EKG None  Radiology Dg Chest 2 View  Result Date: 04/28/2019 CLINICAL DATA:  Right mid back/rib pain EXAM: CHEST - 2 VIEW COMPARISON:  04/25/2019 FINDINGS: Linear scarring in the left upper lobe and right mid lung. No focal consolidation. No pleural effusion or pneumothorax. Heart is normal in size. Visualized osseous structures are within normal limits. IMPRESSION: Normal chest radiographs. Electronically Signed   By: Julian Hy M.D.   On: 04/28/2019 19:10   Dg Ribs Unilateral W/chest Right  Result Date:  04/28/2019 CLINICAL DATA:  Back pain and rib pain after coughing EXAM: RIGHT RIBS AND CHEST - 3+ VIEW COMPARISON:  None. FINDINGS: No fracture or other bone lesions are seen involving the ribs. There is no evidence of pneumothorax or pleural effusion. Both lungs are clear. Heart size and mediastinal contours are within normal limits. IMPRESSION: Negative. Electronically Signed   By: Ulyses Jarred M.D.   On: 04/28/2019 21:32   Dg Thoracic Spine 2 View  Result Date: 04/28/2019 CLINICAL DATA:  Back pain EXAM: THORACIC SPINE 2 VIEWS COMPARISON:  None. FINDINGS: There is no evidence of thoracic spine fracture. Mildly increased thoracic kyphosis centered at the lower thoracic spine. Alignment is normal. No other significant bone abnormalities are identified. IMPRESSION: No acute abnormality of the thoracic spine. Mildly increased thoracic kyphosis centered at the lower thoracic spine. Electronically Signed   By: Ulyses Jarred M.D.   On: 04/28/2019 21:28    Procedures Procedures (including critical care time)  Medications Ordered in ED Medications  orphenadrine (NORFLEX) injection 60 mg (60 mg Intramuscular Given 04/28/19 2117)  dexamethasone (DECADRON) injection 10 mg (10 mg Intramuscular Given 04/28/19 2118)  HYDROcodone-acetaminophen (NORCO/VICODIN) 5-325 MG per tablet 1 tablet (1 tablet Oral Given 04/28/19 2115)     Initial Impression / Assessment and Plan / ED Course  I have reviewed the triage vital signs and the nursing notes.  Pertinent labs & imaging results that were available during my care of the patient were reviewed by me and considered in my medical decision making (see chart for details).        53 year old female with acute right-sided thoracic back pain after sneezing today.  She is recently been diagnosed with Covid, no chest pain, shortness of breath.  She is had mild cold-like symptoms and reports a big sneeze today that caused severe right-sided thoracic back pain.  She did  recover from pneumonia several weeks ago, recent x-ray 3 days ago showed this had resolved and chest x-ray today shows no acute cardiopulmonary process.  Also evaluated thoracic spine and right rib x-rays to evaluate for rib or compression fracture, these show no acute changes.  Patient's muscles along the parathoracic spine are tender to palpation and her  history and exam seem to be consistent with muscular pain.  Her urine was negative for infection.  She is running a low-grade fever but this is most likely secondary to Covid diagnosis 3 days ago.  Vital signs are stable, no chest pain or shortness of breath.  Will discharge home with signs and symptoms return to ED for.  She is given prescription for Flexeril, prednisone, Norco.  Final Clinical Impressions(s) / ED Diagnoses   Final diagnoses:  Muscle spasm of back  Acute right-sided thoracic back pain    ED Discharge Orders         Ordered    methylPREDNISolone (MEDROL) 4 MG tablet  Daily     04/28/19 2158    cyclobenzaprine (FLEXERIL) 5 MG tablet  3 times daily PRN     04/28/19 2158    HYDROcodone-acetaminophen (NORCO) 5-325 MG tablet  Every 4 hours PRN     04/28/19 2158           Renata Caprice 04/28/19 2201    Merlyn Lot, MD 04/28/19 2210

## 2019-04-28 NOTE — ED Triage Notes (Signed)
States recently recovered from pneumonia and tested positive for COVID on Friday (results received Saturday). C/O right rib back rib pain with coughing.  States feels that pain has worsened.  Hurts with C+ DB and movement.    AAOx3.  Skin warm and dry. No SOB/ DOE.  NAD

## 2019-04-29 LAB — NOVEL CORONAVIRUS, NAA (HOSP ORDER, SEND-OUT TO REF LAB; TAT 18-24 HRS): SARS-CoV-2, NAA: DETECTED — AB

## 2019-09-24 ENCOUNTER — Ambulatory Visit: Payer: Managed Care, Other (non HMO) | Admitting: Dermatology

## 2019-09-24 ENCOUNTER — Other Ambulatory Visit: Payer: Self-pay

## 2019-09-24 DIAGNOSIS — S01312A Laceration without foreign body of left ear, initial encounter: Secondary | ICD-10-CM | POA: Diagnosis not present

## 2019-09-24 DIAGNOSIS — L578 Other skin changes due to chronic exposure to nonionizing radiation: Secondary | ICD-10-CM

## 2019-09-24 DIAGNOSIS — D225 Melanocytic nevi of trunk: Secondary | ICD-10-CM | POA: Diagnosis not present

## 2019-09-24 DIAGNOSIS — D229 Melanocytic nevi, unspecified: Secondary | ICD-10-CM

## 2019-09-24 DIAGNOSIS — L821 Other seborrheic keratosis: Secondary | ICD-10-CM

## 2019-09-24 DIAGNOSIS — L814 Other melanin hyperpigmentation: Secondary | ICD-10-CM

## 2019-09-24 DIAGNOSIS — D239 Other benign neoplasm of skin, unspecified: Secondary | ICD-10-CM

## 2019-09-24 DIAGNOSIS — Z1283 Encounter for screening for malignant neoplasm of skin: Secondary | ICD-10-CM

## 2019-09-24 DIAGNOSIS — D1801 Hemangioma of skin and subcutaneous tissue: Secondary | ICD-10-CM

## 2019-09-24 DIAGNOSIS — Z86018 Personal history of other benign neoplasm: Secondary | ICD-10-CM

## 2019-09-24 NOTE — Patient Instructions (Signed)

## 2019-09-24 NOTE — Progress Notes (Signed)
   Follow-Up Visit   Subjective  Tara Young is a 54 y.o. female who presents for the following: Annual Exam (History of dysplastic nevus - ).  Patient presents for total body skin examination for skin cancer screening and mole check.  The following portions of the chart were reviewed this encounter and updated as appropriate:  Tobacco  Allergies  Meds  Problems  Med Hx  Surg Hx  Fam Hx      Review of Systems:  No other skin or systemic complaints except as noted in HPI or Assessment and Plan.  Objective  Well appearing patient in no apparent distress; mood and affect are within normal limits.  A full examination was performed including scalp, head, eyes, ears, nose, lips, neck, chest, axillae, abdomen, back, buttocks, bilateral upper extremities, bilateral lower extremities, hands, feet, fingers, toes, fingernails, and toenails. All findings within normal limits unless otherwise noted below.  Objective  Right mid to low back lat: Healing biopsy site with repigmentation.  Objective  right medial breast at [facility], left UQA: Scar with no evidence of recurrence of right med breast at [facility] and left UQA.  Objective  Left earlobe: Earlobe tear Discussed excision and repair. Patient may consider.  Assessment & Plan    Skin cancer screening performed today.  Actinic Damage - diffuse scaly erythematous macules with underlying dyspigmentation - Recommend daily broad spectrum sunscreen SPF 30+ to sun-exposed areas, reapply every 2 hours as needed.  - Call for new or changing lesions.  Seborrheic Keratoses - Stuck-on, waxy, tan-brown papules and plaques  - Discussed benign etiology and prognosis. - Observe - Call for any changes  Lentigines - Scattered tan macules - Discussed due to sun exposure - Benign, observe - Call for any changes  Melanocytic Nevi - Tan-brown and/or pink-flesh-colored symmetric macules and papules - Benign appearing on exam today -  Observation - Call clinic for new or changing moles - Recommend daily use of broad spectrum spf 30+ sunscreen to sun-exposed areas.   Hemangiomas - Red papules - Discussed benign nature - Observe - Call for any changes   Repigmented biopsy-proven dysplastic nevus Right mid to low back lat  Epidermal / dermal shaving - Right mid to low back lat Defect 1.6 cm Informed consent: discussed and consent obtained   Timeout: patient name, date of birth, surgical site, and procedure verified   Procedure prep:  Patient was prepped and draped in usual sterile fashion Prep type:  Isopropyl alcohol Anesthesia: the lesion was anesthetized in a standard fashion   Anesthetic:  1% lidocaine w/ epinephrine 1-100,000 buffered w/ 8.4% NaHCO3 Instrument used: flexible razor blade   Hemostasis achieved with: pressure, aluminum chloride and electrodesiccation   Outcome: patient tolerated procedure well   Post-procedure details: sterile dressing applied and wound care instructions given   Dressing type: bandage and petrolatum    Specimen 1 - Surgical pathology Differential Diagnosis: Repigmented dysplastic nevus Check Margins: No Healing biopsy site with repigmentation. [id number]  History of dysplastic nevus right medial breast at [facility], left UQA  Clear today. Observe   Return in about 1 year (around 09/23/2020) for TBSE and Surgery appointment for earlobe repair.   I, Ashok Cordia, CMA, am acting as scribe for Sarina Ser, MD .  Documentation: I have reviewed the above documentation for accuracy and completeness, and I agree with the above.  Sarina Ser, MD

## 2019-09-26 ENCOUNTER — Encounter: Payer: Self-pay | Admitting: Dermatology

## 2019-09-29 ENCOUNTER — Telehealth: Payer: Self-pay

## 2019-09-29 NOTE — Telephone Encounter (Signed)
LM on VM to return my call. 

## 2019-10-06 ENCOUNTER — Telehealth: Payer: Self-pay

## 2019-10-06 NOTE — Telephone Encounter (Signed)
LM on VM to return my call. 

## 2019-10-15 ENCOUNTER — Telehealth: Payer: Self-pay

## 2019-10-15 NOTE — Telephone Encounter (Signed)
-----   Message from Ralene Bathe, MD sent at 09/26/2019  6:56 PM EDT ----- Skin , right mid to low back lat FINDINGS COMPATIBLE WITH RECURRENT NEVUS, PERIPHERAL MARGIN INVOLVED, SEE DESCRIPTION  Recurrent dysplastic Recheck at next visit

## 2019-10-15 NOTE — Telephone Encounter (Signed)
Patient informed of pathology results 

## 2019-11-18 ENCOUNTER — Encounter: Payer: Managed Care, Other (non HMO) | Admitting: Dermatology

## 2019-11-25 ENCOUNTER — Ambulatory Visit: Payer: Self-pay

## 2019-11-26 ENCOUNTER — Ambulatory Visit
Admission: EM | Admit: 2019-11-26 | Discharge: 2019-11-26 | Disposition: A | Discharge status: Home / Self Care | Payer: Managed Care, Other (non HMO) | Attending: Emergency Medicine | Admitting: Emergency Medicine

## 2019-11-26 DIAGNOSIS — L03116 Cellulitis of left lower limb: Secondary | ICD-10-CM

## 2019-11-26 DIAGNOSIS — M79672 Pain in left foot: Secondary | ICD-10-CM | POA: Diagnosis not present

## 2019-11-26 MED ORDER — DOXYCYCLINE HYCLATE 100 MG PO CAPS: 100.0000 mg | ORAL_CAPSULE | Freq: Two times a day (BID) | ORAL | 0 refills | Status: DC

## 2019-11-26 MED ORDER — PREDNISONE 20 MG PO TABS: 40.0000 mg | ORAL_TABLET | Freq: Every day | ORAL | 0 refills | Status: DC

## 2019-11-26 NOTE — ED Provider Notes (Signed)
[facility] ____________________________________________  Time seen: Approximately 11:01 AM  I have reviewed the triage vital signs and the nursing notes.   HISTORY  Chief Complaint Foot Pain   HPI Tara Young is a 54 y.o. female present for evaluation of left medial heel pain.  Patient reports she noticed pain this past Saturday while she and her husband were at a campground.  Reports they have been outside a lot.  Denies any fall, direct injury or known trauma.  States some pain with walking on the area but has continued remain ambulatory.  States some swelling as well in the same area.  States that she noticed a small bit of redness that has increased.  States unsure if she was bitten or stung by something.  Denies any other skin changes.  Denies other pains.  No known tick bites.  Denies accompanying fevers or recent sickness.  Has rested area.  Denies other aggravating alleviating factors.  No recent antibiotic use.  Patient does report that she has had plantar fasciitis previously, but reports this pain is different.   Past Medical History:  Diagnosis Date   Allergic rhinitis    Anxiety    Cervical radiculitis    Chronic low back pain    DDD (degenerative disc disease), cervical    Dysplastic nevus 01/29/2017   R med breast at [facility]   Dysplastic nevus 12/04/2017   LUQA - excision   Dysplastic nevus 09/23/2018   R mid to low back lat   Dysplastic nevus 09/24/2019   R mid to low back lat    Gastritis    GERD (gastroesophageal reflux disease)    PNA (pneumonia)    PONV (postoperative nausea and vomiting)     Patient Active Problem List   Diagnosis Date Noted   Major depressive disorder, recurrent, mild ([place]) 12/12/2017   Anxiety disorder 12/12/2017   Insomnia 12/12/2017   PTSD (post-traumatic stress disorder) 08/16/2016   Overweight (BMI 25.0-29.9) 08/09/2016   History of small bowel obstruction 08/09/2016   Mediastinal  lymphadenopathy 08/08/2016   Asthma 08/08/2016   Chronic low back pain    Allergic rhinitis    Cervical radiculitis 03/31/2014   Degeneration of intervertebral disc of cervical region 03/20/2014   History of partial hysterectomy 03/20/2014   Status post right oophorectomy 09/08/2008   Grief at loss of child 01/12/2005   Acne 05/01/2003    Past Surgical History:  Procedure Laterality Date   ABDOMINAL HYSTERECTOMY     COLONOSCOPY WITH PROPOFOL N/A 04/24/2016   Procedure: COLONOSCOPY WITH PROPOFOL;  Surgeon: Manya Silvas, MD;  Location: [facility];  Service: Endoscopy;  Laterality: N/A;   CYSTOSCOPY  05/05/2016   Procedure: CYSTOSCOPY;  Surgeon: Boykin Nearing, MD;  Location: [facility] ORS;  Service: Gynecology;;   ESOPHAGOGASTRODUODENOSCOPY (EGD) WITH PROPOFOL N/A 04/24/2016   Procedure: ESOPHAGOGASTRODUODENOSCOPY (EGD) WITH PROPOFOL;  Surgeon: Manya Silvas, MD;  Location: [place] [facility];  Service: Endoscopy;  Laterality: N/A;   LAPAROSCOPIC LYSIS OF ADHESIONS  05/05/2016   Procedure: LAPAROSCOPIC LYSIS OF ADHESIONS, EXTENSIVE PELVIC AND ABDOMINAL;  Surgeon: Boykin Nearing, MD;  Location: [facility] OR[facility];  Service: Gynecology;;   LAPAROSCOPIC LYSIS OF ADHESIONS  05/05/2016   Procedure: LAPAROSCOPIC LYSIS OF ADHESIONS;  Surgeon: Hubbard Robinson, MD;  Location: [facility];  Service: General;;   LAPAROSCOPIC OVARIAN CYSTECTOMY Right 05/05/2016   Procedure: LAPAROSCOPIC EXCISION RIGHT ADNEXAL CYST;  Surgeon: Boykin Nearing, MD;  Location: [facility];  Service: Gynecology;  Laterality: Right;  LAPAROSCOPIC SALPINGO OOPHERECTOMY Left 05/05/2016   Procedure: LAPAROSCOPIC SALPINGO OOPHORECTOMY;  Surgeon: Boykin Nearing, MD;  Location: [facility];  Service: Gynecology;  Laterality: Left;   RIGHT OOPHORECTOMY     TUBAL LIGATION       No current facility-administered medications for this encounter.  Current Outpatient Medications:    citalopram  (CELEXA) 20 MG tablet, Take 20 mg by mouth daily., Disp: , Rfl:    doxycycline (VIBRAMYCIN) 100 MG capsule, Take 1 capsule (100 mg total) by mouth 2 (two) times daily., Disp: 20 capsule, Rfl: 0   predniSONE (DELTASONE) 20 MG tablet, Take 2 tablets (40 mg total) by mouth daily., Disp: 10 tablet, Rfl: 0   traZODone (DESYREL) 50 MG tablet, Take 25-50 mg by mouth at bedtime., Disp: , Rfl:   Allergies Morphine and related  Family History  Problem Relation Age of Onset   Cancer Father        stomach   Lymphoma Father    Fibromyalgia Mother    Stroke Mother    Cancer Mother     Social History Social History   Tobacco Use   Smoking status: Never Smoker   Smokeless tobacco: Never Used  Scientific laboratory technician Use: Never used  Substance Use Topics   Alcohol use: No   Drug use: No    Review of Systems Constitutional: No fever.  Cardiovascular: Denies chest pain. Respiratory: Denies shortness of breath. Gastrointestinal: No abdominal pain.  No nausea, no vomiting. Musculoskeletal: Positive left foot pain. Skin: Positive skin changes.   ____________________________________________   PHYSICAL EXAM:  VITAL SIGNS: ED Triage Vitals [11/26/19 1028]  Enc Vitals Group     BP 119/67     Pulse Rate 85     Resp 16     Temp (!) 97 F (36.1 C)     Temp src      SpO2 98 %     Weight      Height      Head Circumference      Peak Flow      Pain Score 5     Pain Loc      Pain Edu?      Excl. in [place]?     Constitutional: Alert and oriented. Well appearing and in no acute distress. ENT      Head: Normocephalic and atraumatic. Cardiovascular:Good peripheral circulation. Respiratory: Normal respiratory effort without tachypnea nor retractions.  Musculoskeletal: Steady gait.  Neurologic:  Normal speech and language. Speech is normal. No gait instability.  Skin:  Skin is warm, dry.  Except: Left medial heel along medial midfoot erythema present with tenderness, mild local  swelling, no point bony tenderness, full range of motion present, normal distal sensation capillary refill.  Left foot otherwise nontender. Psychiatric: Mood and affect are normal. Speech and behavior are normal. Patient exhibits appropriate insight and judgment   ___________________________________________   LABS (all labs ordered are listed, but only abnormal results are displayed)  Labs Reviewed - No data to display ____________________________________________   PROCEDURES Procedures     INITIAL IMPRESSION / ASSESSMENT AND PLAN / ED COURSE  Pertinent labs & imaging results that were available during my care of the patient were reviewed by me and considered in my medical decision making (see chart for details).  Well-appearing patient.  No acute distress.  Left foot pain and redness, concern for cellulitis.  Will treat with oral doxycycline, prednisone.  Denies trauma, no point bony abnormality, will defer x-ray at  this time, patient agrees to plan.  Follow-up for continued pain as needed.  Rest, elevation.Discussed indication, risks and benefits of medications with patient including photosensitivity.  Discussed follow up with Primary care physician this week. Discussed follow up and return parameters including no resolution or any worsening concerns. Patient verbalized understanding and agreed to plan.   ____________________________________________   FINAL CLINICAL IMPRESSION(S) / ED DIAGNOSES  Final diagnoses:  Left foot pain  Cellulitis of left foot     ED Discharge Orders         Ordered    predniSONE (DELTASONE) 20 MG tablet  Daily     Discontinue  Reprint     11/26/19 1102    doxycycline (VIBRAMYCIN) 100 MG capsule  2 times daily     Discontinue  Reprint     11/26/19 1102           Note: This dictation was prepared with Dragon dictation along with smaller phrase technology. Any transcriptional errors that result from this process are unintentional.          Marylene Land, NP 11/26/19 1136

## 2019-11-26 NOTE — Discharge Instructions (Addendum)
Take medication as prescribed. Rest. Elevate. Ice. Monitor.   Follow up with your primary care physician this week as needed. Return to Urgent care for new or worsening concerns.

## 2019-11-26 NOTE — ED Triage Notes (Signed)
Pt c/o pain and swelling to left heel since Saturday, no known injury

## 2019-12-09 ENCOUNTER — Ambulatory Visit: Payer: Managed Care, Other (non HMO) | Admitting: Dermatology

## 2019-12-09 ENCOUNTER — Other Ambulatory Visit: Payer: Self-pay

## 2019-12-09 DIAGNOSIS — S01312A Laceration without foreign body of left ear, initial encounter: Secondary | ICD-10-CM | POA: Diagnosis not present

## 2019-12-09 MED ORDER — MUPIROCIN 2 % EX OINT: 1.0000 "application " | TOPICAL_OINTMENT | Freq: Every day | CUTANEOUS | 0 refills | Status: DC

## 2019-12-09 NOTE — Patient Instructions (Signed)

## 2019-12-09 NOTE — Progress Notes (Signed)
   Follow-Up Visit   Subjective  Tara Young is a 54 y.o. female who presents for the following: left ear repair (patient is here today for repair of the left earlobe).  The following portions of the chart were reviewed this encounter and updated as appropriate:  Tobacco  Allergies  Meds  Problems  Med Hx  Surg Hx  Fam Hx     Review of Systems:  No other skin or systemic complaints except as noted in HPI or Assessment and Plan.  Objective  Well appearing patient in no apparent distress; mood and affect are within normal limits.  A focused examination was performed including the left ear. Relevant physical exam findings are noted in the Assessment and Plan.  Objective  L ear lobe: Earlobe tear  Assessment & Plan  Tear of left earlobe laceration with scar tissue L ear lobe  Start Mupirocin 2% ointment to aa QD after wound care.   Skin excision - L ear lobe  Lesion length (cm):  3.2 Total excision diameter (cm):  3.2 Informed consent: discussed and consent obtained   Timeout: patient name, date of birth, surgical site, and procedure verified   Procedure prep:  Patient was prepped and draped in usual sterile fashion Prep type:  Isopropyl alcohol and povidone-iodine Anesthesia: the lesion was anesthetized in a standard fashion   Anesthetic:  1% lidocaine w/ epinephrine 1-100,000 buffered w/ 8.4% NaHCO3 Hemostasis achieved with: pressure   Hemostasis achieved with comment:  Electrocautery Outcome: patient tolerated procedure well with no complications   Post-procedure details: sterile dressing applied and wound care instructions given   Dressing type: bandage and pressure dressing    Skin repair - L ear lobe Complexity:  Complex Final length (cm):  3.2 Informed consent: discussed and consent obtained   Timeout: patient name, date of birth, surgical site, and procedure verified   Procedure prep:  Patient was prepped and draped in usual sterile fashion Prep  type:  Povidone-iodine Anesthesia: the lesion was anesthetized in a standard fashion   Reason for type of repair: reduce tension to allow closure, reduce the risk of dehiscence, infection, and necrosis, reduce subcutaneous dead space and avoid a hematoma, allow closure of the large defect, preserve normal anatomy, preserve normal anatomical and functional relationships and enhance both functionality and cosmetic results   Subcutaneous layers (deep stitches):  Suture size:  5-0 Suture type: Vicryl (polyglactin 910)   Fine/surface layer approximation (top stitches):  Suture size:  5-0 Suture type: nylon   Stitches: simple interrupted   Suture removal (days):  7 Hemostasis achieved with: suture and pressure Outcome: patient tolerated procedure well with no complications   Post-procedure details: sterile dressing applied and wound care instructions given   Dressing type: bandage and pressure dressing    mupirocin ointment (BACTROBAN) 2 % - L ear lobe  Return in about 1 week (around 12/16/2019).  Luther Redo, CMA, am acting as scribe for Sarina Ser, MD .  Documentation: I have reviewed the above documentation for accuracy and completeness, and I agree with the above.  Sarina Ser, MD

## 2019-12-13 ENCOUNTER — Encounter: Payer: Self-pay | Admitting: Dermatology

## 2019-12-16 ENCOUNTER — Other Ambulatory Visit: Payer: Self-pay

## 2019-12-16 ENCOUNTER — Ambulatory Visit (INDEPENDENT_AMBULATORY_CARE_PROVIDER_SITE_OTHER): Payer: Managed Care, Other (non HMO) | Admitting: Dermatology

## 2019-12-16 DIAGNOSIS — Z4802 Encounter for removal of sutures: Secondary | ICD-10-CM

## 2019-12-16 NOTE — Progress Notes (Signed)
   Follow-Up Visit   Subjective  Tara Young is a 54 y.o. female who presents for the following: Follow-up (suture removal).  Patient presents today for follow up on OV 12/09/19, patient returns after repair of earlobe laceration for suture removal  The following portions of the chart were reviewed this encounter and updated as appropriate:   Tobacco  Allergies  Meds  Problems  Med Hx  Surg Hx  Fam Hx      Review of Systems:  No other skin or systemic complaints except as noted in HPI or Assessment and Plan.  Objective  Well appearing patient in no apparent distress; mood and affect are within normal limits.  A focused examination was performed including left earlobe. Relevant physical exam findings are noted in the Assessment and Plan.  Objective  Left Anterior Lobule : Excision site is clean, dry and intact    Assessment & Plan    Encounter for removal of sutures Left Earlobe  returns after repair of earlobe laceration for suture removal  - Wound cleansed, sutures removed, wound cleansed and steri strips applied.  - Patient advised to keep steri-strips dry until they fall off. - Scars remodel for a full year. - Once steri-strips fall off, patient can apply over-the-counter silicone scar cream each night to help with scar remodeling if desired. - Patient advised to call with any concerns or if they notice any new or changing lesions.    Return for Schedule re piercing with Dr. Dara Lords, Donzetta Kohut, CMA, am acting as scribe for Sarina Ser, MD . Documentation: I have reviewed the above documentation for accuracy and completeness, and I agree with the above.  Sarina Ser, MD

## 2019-12-16 NOTE — Patient Instructions (Signed)
Recommend daily broad spectrum sunscreen SPF 30+ to sun-exposed areas, reapply every 2 hours as needed. Call for new or changing lesions.  

## 2019-12-19 ENCOUNTER — Encounter: Payer: Self-pay | Admitting: Dermatology

## 2019-12-22 ENCOUNTER — Encounter: Payer: Self-pay | Admitting: Dermatology

## 2020-02-10 ENCOUNTER — Other Ambulatory Visit: Payer: Self-pay

## 2020-02-10 ENCOUNTER — Ambulatory Visit (INDEPENDENT_AMBULATORY_CARE_PROVIDER_SITE_OTHER): Payer: Self-pay | Admitting: Dermatology

## 2020-02-10 DIAGNOSIS — Z413 Encounter for ear piercing: Secondary | ICD-10-CM

## 2020-02-10 NOTE — Progress Notes (Signed)
   Follow-Up Visit   Subjective  Tara Young is a 54 y.o. female who presents for the following: ear piercing (L ear - S/P surgical repair).  The following portions of the chart were reviewed this encounter and updated as appropriate:  Tobacco  Allergies  Meds  Problems  Med Hx  Surg Hx  Fam Hx     Review of Systems:  No other skin or systemic complaints except as noted in HPI or Assessment and Plan.  Objective  Well appearing patient in no apparent distress; mood and affect are within normal limits.  A focused examination was performed including the ears. Relevant physical exam findings are noted in the Assessment and Plan.  Objective  L ear lobe: Clear other than thickening at previous surgical site.  Assessment & Plan  Encounter for left ear piercing L ear lobe  Area was cleansed with Puracyn spray, then alcohol, marked for patient to view the intended site for piercing.  She approved the piercing site and then ear pierced with ear piercing gun. Patient tolerated the procedure well. Advised to RTC for any complications, leave in stud for at least 2 weeks, and keep an earring in for at least 6 weeks.   Return if symptoms worsen or fail to improve.  Luther Redo, CMA, am acting as scribe for Sarina Ser, MD .  Documentation: I have reviewed the above documentation for accuracy and completeness, and I agree with the above.  Sarina Ser, MD

## 2020-02-11 ENCOUNTER — Encounter: Payer: Self-pay | Admitting: Dermatology

## 2020-02-11 NOTE — Patient Instructions (Signed)
[id number] 

## 2020-03-15 ENCOUNTER — Other Ambulatory Visit: Payer: Self-pay

## 2020-03-15 ENCOUNTER — Ambulatory Visit: Payer: Self-pay

## 2020-03-15 ENCOUNTER — Ambulatory Visit
Admission: RE | Admit: 2020-03-15 | Discharge: 2020-03-15 | Disposition: A | Discharge status: Home / Self Care | Payer: Managed Care, Other (non HMO) | Source: Ambulatory Visit | Attending: Family | Admitting: Family

## 2020-03-15 VITALS — BP 118/93 | HR 93 | Temp 98.6°F | Resp 18 | Ht 66.0 in | Wt 185.0 lb

## 2020-03-15 DIAGNOSIS — H6001 Abscess of right external ear: Secondary | ICD-10-CM

## 2020-03-15 DIAGNOSIS — R590 Localized enlarged lymph nodes: Secondary | ICD-10-CM

## 2020-03-15 DIAGNOSIS — H6011 Cellulitis of right external ear: Secondary | ICD-10-CM

## 2020-03-15 DIAGNOSIS — H9201 Otalgia, right ear: Secondary | ICD-10-CM | POA: Diagnosis not present

## 2020-03-15 MED ORDER — PREDNISONE 20 MG PO TABS: 20.0000 mg | ORAL_TABLET | Freq: Every day | ORAL | 0 refills | Status: AC

## 2020-03-15 MED ORDER — FLUCONAZOLE 150 MG PO TABS: 150.0000 mg | ORAL_TABLET | Freq: Once | ORAL | 0 refills | Status: AC

## 2020-03-15 MED ORDER — CEPHALEXIN 500 MG PO CAPS: 500.0000 mg | ORAL_CAPSULE | Freq: Three times a day (TID) | ORAL | 0 refills | Status: AC

## 2020-03-15 NOTE — ED Provider Notes (Signed)
[facility]    CSN: [id number] Arrival date & time: 03/15/20  0945      History   Chief Complaint Chief Complaint  Patient presents with  . Otalgia  . Facial Swelling    HPI Tara Young is a 54 y.o. female.   54 year old female presents with redness, swelling and pain on the upper part of her right ear. She noticed a "bump" or possible pimple-like lesion about 3 days ago. Yesterday started becoming more red and swollen and today area is very painful and swollen. Swelling and redness have spread to her face and down her neck. Teeth hurt on her right side. Lymph nodes on right side also swollen. Not sure if lesion is draining. Does not remember any insect or spider bite to area. Denies any fever, difficulty swallowing, chest pain or difficulty breathing. Has taken Ibuprofen with some relief. Other chronic health issues include anxiety and insomnia and takes Celexa and Trazodone daily.   The history is provided by the patient.    Past Medical History:  Diagnosis Date  . Allergic rhinitis   . Anxiety   . Cervical radiculitis   . Chronic low back pain   . DDD (degenerative disc disease), cervical   . Dysplastic nevus 01/29/2017   R med breast at [place]  . Dysplastic nevus 12/04/2017   LUQA - excision  . Dysplastic nevus 09/23/2018   R mid to low back lat  . Dysplastic nevus 09/24/2019   R mid to low back lat   . Gastritis   . GERD (gastroesophageal reflux disease)   . PNA (pneumonia)   . PONV (postoperative nausea and vomiting)     Patient Active Problem List   Diagnosis Date Noted  . Major depressive disorder, recurrent, mild ([place]) 12/12/2017  . Anxiety disorder 12/12/2017  . Insomnia 12/12/2017  . PTSD (post-traumatic stress disorder) 08/16/2016  . Overweight (BMI 25.0-29.9) 08/09/2016  . History of small bowel obstruction 08/09/2016  . Mediastinal lymphadenopathy 08/08/2016  . Asthma 08/08/2016  . Chronic low back pain   . Allergic  rhinitis   . Cervical radiculitis 03/31/2014  . Degeneration of intervertebral disc of cervical region 03/20/2014  . History of partial hysterectomy 03/20/2014  . Status post right oophorectomy 09/08/2008  . Grief at loss of child 01/12/2005  . Acne 05/01/2003    Past Surgical History:  Procedure Laterality Date  . ABDOMINAL HYSTERECTOMY    . COLONOSCOPY WITH PROPOFOL N/A 04/24/2016   Procedure: COLONOSCOPY WITH PROPOFOL;  Surgeon: Manya Silvas, MD;  Location: [facility] ENDOSCOPY;  Service: Endoscopy;  Laterality: N/A;  . CYSTOSCOPY  05/05/2016   Procedure: CYSTOSCOPY;  Surgeon: Boykin Nearing, MD;  Location: [facility] ORS;  Service: Gynecology;;  . ESOPHAGOGASTRODUODENOSCOPY (EGD) WITH PROPOFOL N/A 04/24/2016   Procedure: ESOPHAGOGASTRODUODENOSCOPY (EGD) WITH PROPOFOL;  Surgeon: Manya Silvas, MD;  Location: [facility] ENDOSCOPY;  Service: Endoscopy;  Laterality: N/A;  . LAPAROSCOPIC LYSIS OF ADHESIONS  05/05/2016   Procedure: LAPAROSCOPIC LYSIS OF ADHESIONS, EXTENSIVE PELVIC AND ABDOMINAL;  Surgeon: Boykin Nearing, MD;  Location: [facility] ORS;  Service: Gynecology;;  . LAPAROSCOPIC LYSIS OF ADHESIONS  05/05/2016   Procedure: LAPAROSCOPIC LYSIS OF ADHESIONS;  Surgeon: Hubbard Robinson, MD;  Location: [facility] ORS;  Service: General;;  . LAPAROSCOPIC OVARIAN CYSTECTOMY Right 05/05/2016   Procedure: LAPAROSCOPIC EXCISION RIGHT ADNEXAL CYST;  Surgeon: Boykin Nearing, MD;  Location: [facility] ORS;  Service: Gynecology;  Laterality: Right;  . LAPAROSCOPIC SALPINGO OOPHERECTOMY Left 05/05/2016  Procedure: LAPAROSCOPIC SALPINGO OOPHORECTOMY;  Surgeon: Boykin Nearing, MD;  Location: [facility] ORS;  Service: Gynecology;  Laterality: Left;  . RIGHT OOPHORECTOMY    . TUBAL LIGATION      OB History   No obstetric history on file.      Home Medications    Prior to Admission medications   Medication Sig Start Date End Date Taking? Authorizing Provider  citalopram (CELEXA) 20 MG tablet  Take 20 mg by mouth daily.   Yes [provider]  traZODone (DESYREL) 50 MG tablet Take 25-50 mg by mouth at bedtime.   Yes [provider]  cephALEXin (KEFLEX) 500 MG capsule Take 1 capsule (500 mg total) by mouth 3 (three) times daily for 7 days. 03/15/20 03/22/20  Katy Apo, NP  predniSONE (DELTASONE) 20 MG tablet Take 1 tablet (20 mg total) by mouth daily for 3 days. 03/15/20 03/18/20  Katy Apo, NP    Family History Family History  Problem Relation Age of Onset  . Cancer Father        stomach  . Lymphoma Father   . Fibromyalgia Mother   . Stroke Mother   . Cancer Mother     Social History Social History   Tobacco Use  . Smoking status: Never Smoker  . Smokeless tobacco: Never Used  Vaping Use  . Vaping Use: Never used  Substance Use Topics  . Alcohol use: No  . Drug use: No     Allergies   Morphine and related   Review of Systems Review of Systems  Constitutional: Positive for fatigue. Negative for activity change, appetite change, chills and fever.  HENT: Positive for ear pain and facial swelling. Negative for congestion, dental problem, ear discharge, mouth sores, sore throat and trouble swallowing.   Respiratory: Negative for chest tightness and shortness of breath.   Gastrointestinal: Negative for nausea and vomiting.  Musculoskeletal: Positive for neck pain. Negative for neck stiffness.  Skin: Positive for color change. Negative for rash.  Allergic/Immunologic: Positive for environmental allergies. Negative for food allergies and immunocompromised state.  Neurological: Negative for dizziness, seizures, syncope, speech difficulty, weakness, light-headedness and numbness.  Hematological: Positive for adenopathy. Does not bruise/bleed easily.  Psychiatric/Behavioral: Positive for sleep disturbance.     Physical Exam Triage Vital Signs ED Triage Vitals  Enc Vitals Group     BP 03/15/20 1003 (!) 118/93     Pulse Rate 03/15/20  1003 93     Resp 03/15/20 1003 18     Temp 03/15/20 1003 98.6 F (37 C)     Temp Source 03/15/20 1003 Oral     SpO2 03/15/20 1003 98 %     Weight 03/15/20 1001 185 lb (83.9 kg)     Height 03/15/20 1001 5\' 6"  (1.676 m)     Head Circumference --      Peak Flow --      Pain Score 03/15/20 1001 5     Pain Loc --      Pain Edu? --      Excl. in [place]? --    No data found.  Updated Vital Signs BP (!) 118/93 (BP Location: Right Arm)   Pulse 93   Temp 98.6 F (37 C) (Oral)   Resp 18   Ht 5\' 6"  (1.676 m)   Wt 185 lb (83.9 kg)   SpO2 98%   BMI 29.86 kg/m   Visual Acuity Right Eye Distance:   Left Eye Distance:   Bilateral Distance:  Right Eye Near:   Left Eye Near:    Bilateral Near:     Physical Exam Vitals and nursing note reviewed.  Constitutional:      General: She is awake. She is not in acute distress.    Appearance: She is well-developed and well-groomed.     Comments: She is sitting comfortably in the exam chair in no acute distress but appears in pain.   HENT:     Head: Normocephalic.     Jaw: There is normal jaw occlusion. Tenderness and swelling present.      Comments: Erythema and swelling present from just above right pinna on temporal area to mid maxillary area and down to anterior cervical lymph nodes. Minimal redness and swelling behind ear. Very tender to palpation.     Right Ear: Hearing and tympanic membrane normal. Swelling and tenderness present. Tympanic membrane is not injected, perforated or bulging.     Left Ear: Hearing and external ear normal.     Ears:      Comments: 30mm raised red lesion with slight bloody to yellowish fluid draining on right upper inner pinna area. Very tender and swollen.  Right ear canal smaller due to swelling externally. No internal redness or discharge. TM is normal.     Nose: Nose normal.     Mouth/Throat:     Lips: Pink.     Mouth: Mucous membranes are moist.     Pharynx: Oropharynx is clear.  Eyes:      Extraocular Movements: Extraocular movements intact.     Conjunctiva/sclera: Conjunctivae normal.  Cardiovascular:     Rate and Rhythm: Normal rate.  Pulmonary:     Effort: Pulmonary effort is normal.  Musculoskeletal:     Cervical back: Normal range of motion and neck supple. No rigidity.  Lymphadenopathy:     Head:     Right side of head: Tonsillar and preauricular adenopathy present. No posterior auricular adenopathy.     Left side of head: No tonsillar or preauricular adenopathy.     Cervical: Cervical adenopathy present.     Right cervical: Superficial cervical adenopathy present.     Left cervical: No superficial cervical adenopathy.     Comments: Right preauricular lymphadenopathy as well as right tonsillar and right anterior cervical lymph nodes swollen and tender.   Skin:    General: Skin is warm and dry.     Findings: Abscess and erythema present. No bruising or ecchymosis.  Neurological:     General: No focal deficit present.     Mental Status: She is alert and oriented to person, place, and time.  Psychiatric:        Mood and Affect: Mood normal.        Behavior: Behavior normal. Behavior is cooperative.        Thought Content: Thought content normal.        Judgment: Judgment normal.      UC Treatments / Results  Labs (all labs ordered are listed, but only abnormal results are displayed) Labs Reviewed - No data to display  EKG   Radiology No results found.  Procedures Procedures (including critical care time)  Medications Ordered in UC Medications - No data to display  Initial Impression / Assessment and Plan / UC Course  I have reviewed the triage vital signs and the nursing notes.  Pertinent labs & imaging results that were available during my care of the patient were reviewed by me and considered in my medical decision making (  see chart for details).    Reviewed with patient that she has a small abscess on the exterior upper area of her ear lobe  and some cellulitis. Will treat with Keflex 500mg  3 times a day as directed. May also take Prednisone 20mg  daily for 3 days to help with swelling and inflammation. May take Diflucan 150mg  one time if vaginal yeast infection develops from antibiotic use. Recommend apply cool compresses to the area for comfort and alternate with heat to help facilitate drainage. May need to follow-up with an ENT if redness and swelling do not improve quickly. Follow-up here in 2 days if not improving or sooner if worsening.  Final Clinical Impressions(s) / UC Diagnoses   Final diagnoses:  Abscess of right pinna  Right ear pain  Preauricular lymphadenopathy  Cellulitis of right ear     Discharge Instructions     Recommend start Keflex (antibiotic) 500mg  3 times a day as directed. Recommend take Prednisone 20mg  daily for 3 days to help with inflammation. May also take Diflucan 150mg  one time if needed if you develop a yeast infection from antibiotic use. Recommend apply cool compresses to area for comfort and alternate with heat to help with drainage and comfort. May need to follow-up with an ENT if swelling and pain does not improve. Follow-up in 2 days if not improving or sooner if worsening.     ED Prescriptions    Medication Sig Dispense Auth. Provider   predniSONE (DELTASONE) 20 MG tablet Take 1 tablet (20 mg total) by mouth daily for 3 days. 3 tablet Katy Apo, NP   cephALEXin (KEFLEX) 500 MG capsule Take 1 capsule (500 mg total) by mouth 3 (three) times daily for 7 days. 21 capsule Katy Apo, NP   fluconazole (DIFLUCAN) 150 MG tablet Take 1 tablet (150 mg total) by mouth once for 1 dose. 1 tablet Lois Ostrom, Nicholes Stairs, NP     PDMP not reviewed this encounter.   Katy Apo, NP 03/16/20 1041

## 2020-03-15 NOTE — Discharge Instructions (Addendum)
Recommend start Keflex (antibiotic) 500mg  3 times a day as directed. Recommend take Prednisone 20mg  daily for 3 days to help with inflammation. May also take Diflucan 150mg  one time if needed if you develop a yeast infection from antibiotic use. Recommend apply cool compresses to area for comfort and alternate with heat to help with drainage and comfort. May need to follow-up with an ENT if swelling and pain does not improve. Follow-up in 2 days if not improving or sooner if worsening.

## 2020-03-15 NOTE — ED Triage Notes (Signed)
Patient c/o right ear pain that started on Saturday. She states the pain is on the outside of her ear and she is having swelling to the area.

## 2020-08-20 ENCOUNTER — Other Ambulatory Visit: Payer: Self-pay | Admitting: Orthopedic Surgery

## 2020-08-20 DIAGNOSIS — M25562 Pain in left knee: Secondary | ICD-10-CM

## 2020-08-31 ENCOUNTER — Ambulatory Visit
Admission: RE | Admit: 2020-08-31 | Discharge: 2020-08-31 | Disposition: A | Discharge status: Home / Self Care | Payer: Managed Care, Other (non HMO) | Source: Ambulatory Visit | Attending: Orthopedic Surgery | Admitting: Orthopedic Surgery

## 2020-08-31 ENCOUNTER — Other Ambulatory Visit: Payer: Self-pay

## 2020-08-31 DIAGNOSIS — M25562 Pain in left knee: Secondary | ICD-10-CM | POA: Diagnosis present

## 2020-09-21 ENCOUNTER — Encounter: Payer: Self-pay | Admitting: Dermatology

## 2020-09-23 ENCOUNTER — Ambulatory Visit: Payer: Managed Care, Other (non HMO) | Admitting: Dermatology

## 2021-01-10 ENCOUNTER — Encounter: Payer: Self-pay | Admitting: Dermatology

## 2021-01-17 ENCOUNTER — Ambulatory Visit: Payer: Managed Care, Other (non HMO) | Admitting: Dermatology

## 2021-02-03 ENCOUNTER — Other Ambulatory Visit: Payer: Self-pay | Admitting: Orthopedic Surgery

## 2021-02-07 ENCOUNTER — Encounter: Payer: Self-pay | Admitting: Dermatology

## 2021-02-07 ENCOUNTER — Encounter: Payer: Self-pay | Admitting: Orthopedic Surgery

## 2021-02-15 ENCOUNTER — Ambulatory Visit: Payer: 59 | Admitting: Anesthesiology

## 2021-02-15 ENCOUNTER — Encounter
Admission: RE | Disposition: A | Discharge status: Home / Self Care | Payer: Self-pay | Source: Ambulatory Visit | Attending: Orthopedic Surgery

## 2021-02-15 ENCOUNTER — Encounter: Payer: Self-pay | Admitting: Orthopedic Surgery

## 2021-02-15 ENCOUNTER — Other Ambulatory Visit: Payer: Self-pay

## 2021-02-15 ENCOUNTER — Ambulatory Visit
Admission: RE | Admit: 2021-02-15 | Discharge: 2021-02-15 | Disposition: A | Discharge status: Home / Self Care | Payer: 59 | Source: Ambulatory Visit | Attending: Orthopedic Surgery | Admitting: Orthopedic Surgery

## 2021-02-15 DIAGNOSIS — X58XXXA Exposure to other specified factors, initial encounter: Secondary | ICD-10-CM | POA: Insufficient documentation

## 2021-02-15 DIAGNOSIS — Z79899 Other long term (current) drug therapy: Secondary | ICD-10-CM | POA: Insufficient documentation

## 2021-02-15 DIAGNOSIS — Z1231 Encounter for screening mammogram for malignant neoplasm of breast: Secondary | ICD-10-CM

## 2021-02-15 DIAGNOSIS — S83232A Complex tear of medial meniscus, current injury, left knee, initial encounter: Secondary | ICD-10-CM | POA: Diagnosis present

## 2021-02-15 DIAGNOSIS — Z885 Allergy status to narcotic agent status: Secondary | ICD-10-CM | POA: Insufficient documentation

## 2021-02-15 HISTORY — PX: KNEE ARTHROSCOPY WITH MEDIAL MENISECTOMY: SHX5651

## 2021-02-15 SURGERY — ARTHROSCOPY, KNEE, WITH MEDIAL MENISCECTOMY
Anesthesia: General | Site: Knee | Laterality: Left

## 2021-02-15 MED ORDER — LACTATED RINGERS IV SOLN: INTRAVENOUS | Status: DC

## 2021-02-15 MED ORDER — GLYCOPYRROLATE 0.2 MG/ML IJ SOLN
INTRAMUSCULAR | Status: DC | PRN
  Administered 2021-02-15: .1 mg via INTRAVENOUS

## 2021-02-15 MED ORDER — IBUPROFEN 800 MG PO TABS: 800.0000 mg | ORAL_TABLET | Freq: Three times a day (TID) | ORAL | 1 refills | Status: AC

## 2021-02-15 MED ORDER — LACTATED RINGERS IR SOLN
Status: DC | PRN
  Administered 2021-02-15: 6000 mL

## 2021-02-15 MED ORDER — CEFAZOLIN SODIUM-DEXTROSE 2-4 GM/100ML-% IV SOLN
2.0000 g | INTRAVENOUS | Status: AC
  Administered 2021-02-15: 2 g via INTRAVENOUS

## 2021-02-15 MED ORDER — ONDANSETRON HCL 4 MG/2ML IJ SOLN
4.0000 mg | Freq: Once | INTRAMUSCULAR | Status: AC | PRN
  Administered 2021-02-15: 4 mg via INTRAVENOUS

## 2021-02-15 MED ORDER — LIDOCAINE HCL (CARDIAC) PF 100 MG/5ML IV SOSY
PREFILLED_SYRINGE | INTRAVENOUS | Status: DC | PRN
  Administered 2021-02-15: 50 mg via INTRATRACHEAL

## 2021-02-15 MED ORDER — HYDROCODONE-ACETAMINOPHEN 5-325 MG PO TABS: 1.0000 | ORAL_TABLET | ORAL | 0 refills | Status: DC | PRN

## 2021-02-15 MED ORDER — ONDANSETRON 4 MG PO TBDP: 4.0000 mg | ORAL_TABLET | Freq: Three times a day (TID) | ORAL | 0 refills | Status: DC | PRN

## 2021-02-15 MED ORDER — DEXAMETHASONE SODIUM PHOSPHATE 4 MG/ML IJ SOLN
INTRAMUSCULAR | Status: DC | PRN
  Administered 2021-02-15: 4 mg via INTRAVENOUS

## 2021-02-15 MED ORDER — ACETAMINOPHEN 160 MG/5ML PO SOLN: 325.0000 mg | ORAL | Status: DC | PRN

## 2021-02-15 MED ORDER — ACETAMINOPHEN 500 MG PO TABS: 1000.0000 mg | ORAL_TABLET | Freq: Three times a day (TID) | ORAL | 2 refills | Status: AC

## 2021-02-15 MED ORDER — LIDOCAINE-EPINEPHRINE 1 %-1:100000 IJ SOLN
INTRAMUSCULAR | Status: DC | PRN
  Administered 2021-02-15: 12 mL via INTRAMUSCULAR

## 2021-02-15 MED ORDER — ASPIRIN EC 325 MG PO TBEC: 325.0000 mg | DELAYED_RELEASE_TABLET | Freq: Every day | ORAL | 0 refills | Status: AC

## 2021-02-15 MED ORDER — HYDROMORPHONE HCL 1 MG/ML IJ SOLN: 0.2500 mg | INTRAMUSCULAR | Status: DC | PRN

## 2021-02-15 MED ORDER — MIDAZOLAM HCL 5 MG/5ML IJ SOLN
INTRAMUSCULAR | Status: DC | PRN
Start: 2021-02-15 — End: 2021-02-15
  Administered 2021-02-15: 2 mg via INTRAVENOUS

## 2021-02-15 MED ORDER — OXYCODONE HCL 5 MG/5ML PO SOLN: 5.0000 mg | Freq: Once | ORAL | Status: AC | PRN

## 2021-02-15 MED ORDER — FENTANYL CITRATE (PF) 100 MCG/2ML IJ SOLN
INTRAMUSCULAR | Status: DC | PRN
  Administered 2021-02-15 (×3): 50 ug via INTRAVENOUS

## 2021-02-15 MED ORDER — OXYCODONE HCL 5 MG PO TABS
5.0000 mg | ORAL_TABLET | Freq: Once | ORAL | Status: AC | PRN
  Administered 2021-02-15: 5 mg via ORAL

## 2021-02-15 MED ORDER — ONDANSETRON HCL 4 MG/2ML IJ SOLN
INTRAMUSCULAR | Status: DC | PRN
  Administered 2021-02-15: 4 mg via INTRAVENOUS

## 2021-02-15 MED ORDER — ACETAMINOPHEN 325 MG PO TABS: 325.0000 mg | ORAL_TABLET | ORAL | Status: DC | PRN

## 2021-02-15 MED ORDER — PROPOFOL 10 MG/ML IV BOLUS
INTRAVENOUS | Status: DC | PRN
  Administered 2021-02-15: 150 mg via INTRAVENOUS

## 2021-02-15 MED ORDER — SCOPOLAMINE 1 MG/3DAYS TD PT72
1.0000 | MEDICATED_PATCH | TRANSDERMAL | Status: DC
  Administered 2021-02-15: 1.5 mg via TRANSDERMAL

## 2021-02-15 SURGICAL SUPPLY — 35 items
ADAPTER IRRIG TUBE 2 SPIKE SOL (ADAPTER) ×2 IMPLANT
ADPR TBG 2 SPK PMP STRL ASCP (ADAPTER) ×1
APL PRP STRL LF DISP 70% ISPRP (MISCELLANEOUS) ×1
BLADE SURG SZ11 CARB STEEL (BLADE) ×2 IMPLANT
BNDG ESMARK 6X12 TAN STRL LF (GAUZE/BANDAGES/DRESSINGS) ×2 IMPLANT
BUR RADIUS 4.0X18.5 (BURR) ×2 IMPLANT
CHLORAPREP W/TINT 26 (MISCELLANEOUS) ×2 IMPLANT
COOLER POLAR GLACIER W/PUMP (MISCELLANEOUS) ×2 IMPLANT
COVER LIGHT HANDLE UNIVERSAL (MISCELLANEOUS) ×4 IMPLANT
CUFF TOURN SGL QUICK 30 (TOURNIQUET CUFF) ×2
CUFF TRNQT CYL 30X4X21-28X (TOURNIQUET CUFF) ×1 IMPLANT
DRAPE IMP U-DRAPE 54X76 (DRAPES) ×2 IMPLANT
GLOVE SRG 8 PF TXTR STRL LF DI (GLOVE) ×1 IMPLANT
GLOVE SURG ENC MOIS LTX SZ7.5 (GLOVE) ×2 IMPLANT
GLOVE SURG UNDER POLY LF SZ8 (GLOVE) ×2
GOWN STRL REUS W/ TWL LRG LVL3 (GOWN DISPOSABLE) ×1 IMPLANT
GOWN STRL REUS W/TWL LRG LVL3 (GOWN DISPOSABLE) ×2
GOWN STRL REUS W/TWL XL LVL3 (GOWN DISPOSABLE) ×2 IMPLANT
IV LACTATED RINGER IRRG 3000ML (IV SOLUTION) ×4
IV LR IRRIG 3000ML ARTHROMATIC (IV SOLUTION) ×2 IMPLANT
KIT TURNOVER KIT A (KITS) ×2 IMPLANT
MANIFOLD NEPTUNE II (INSTRUMENTS) ×2 IMPLANT
MAT ABSORB  FLUID 56X50 GRAY (MISCELLANEOUS) ×2
MAT ABSORB FLUID 56X50 GRAY (MISCELLANEOUS) ×2 IMPLANT
PACK ARTHROSCOPY KNEE (MISCELLANEOUS) ×2 IMPLANT
PAD ABD DERMACEA PRESS 5X9 (GAUZE/BANDAGES/DRESSINGS) ×2 IMPLANT
PAD WRAPON POLAR KNEE (MISCELLANEOUS) ×1 IMPLANT
SET TUBE SUCT SHAVER OUTFL 24K (TUBING) ×2 IMPLANT
SPONGE T-LAP 18X18 ~~LOC~~+RFID (SPONGE) ×2 IMPLANT
SUT ETHILON 3-0 FS-10 30 BLK (SUTURE) ×2
SUTURE EHLN 3-0 FS-10 30 BLK (SUTURE) ×1 IMPLANT
TOWEL OR 17X26 4PK STRL BLUE (TOWEL DISPOSABLE) ×4 IMPLANT
TUBING ARTHRO INFLOW-ONLY STRL (TUBING) ×2 IMPLANT
WAND WEREWOLF FLOW 90D (MISCELLANEOUS) ×2 IMPLANT
WRAPON POLAR PAD KNEE (MISCELLANEOUS) ×2

## 2021-02-15 NOTE — H&P (Signed)
Paper H&P to be scanned into permanent record. H&P reviewed. No significant changes noted.  

## 2021-02-15 NOTE — Op Note (Signed)
Operative Note    SURGERY DATE: 02/15/2021   PRE-OP DIAGNOSIS:  1. Left medial meniscus tear   POST-OP DIAGNOSIS:  1. Left medial meniscus tear 2. Left early tricompartmental degenerative changes   PROCEDURES:  1.  Left knee arthroscopy, partial medial meniscectomy 2.  Chondroplasty of medial, and compartment   SURGEON: Cato Mulligan, MD   ANESTHESIA: Gen   ESTIMATED BLOOD LOSS: minimal   TOTAL IV FLUIDS: per anesthesia   INDICATION(S):  Tara Young is a 55 y.o. female with left knee pain for over 7 months.  She has undergone extensive nonoperative management including medical management, activity modifications, corticosteroid injections, and viscosupplementation injection.  Clinical exam and MRI was consistent with medial meniscus tear. After discussion of risks, benefits, and alternatives to surgery, the patient elected to proceed.   OPERATIVE FINDINGS:    Examination under anesthesia: A careful examination under anesthesia was performed.  Passive range of motion was: Hyperextension: 1.  Extension: 0.  Flexion: 130.  Lachman: normal. Pivot Shift: normal.  Posterior drawer: normal.  Varus stability in full extension: normal.  Varus stability in 30 degrees of flexion: normal.  Valgus stability in full extension: normal.  Valgus stability in 30 degrees of flexion: normal.   Intra-operative findings: A thorough arthroscopic examination of the knee was performed.  The findings are: 1. Suprapatellar pouch: Normal 2. Undersurface of median ridge: Focal grade 2 degenerative changes 3. Medial patellar facet: Grade 1 softening 4. Lateral patellar facet: Grade 1 softening 5. Trochlea: Grade 1 degenerative changes 6. Lateral gutter/popliteus tendon: Normal 7. Hoffa's fat pad: Inflamed 8. Medial gutter/plica: Normal 9. ACL: Normal 10. PCL: Normal 11. Medial meniscus: Complex tear with primarily horizontal tear pattern of the posterior horn affecting approximately 40% of the  meniscus width involving most of the posterior horn including the meniscus root. 12. Medial compartment cartilage: Grade 3 degenerative changes to the medial femoral condyle and grade 2 changes to the tibial plateau 13. Lateral meniscus: Minimal fraying of the body 14. Lateral compartment cartilage: Grade 1-2 degenerative changes to the tibial plateau; normal lateral femoral condyle   OPERATIVE REPORT:     I identified Gracia Summers Tomasello in the pre-operative holding area. I marked the operative knee with my initials. I reviewed the risks and benefits of the proposed surgical intervention and the patient wished to proceed. The patient was transferred to the operative suite and placed in the supine position with all bony prominences padded.  Anesthesia was administered. Appropriate IV antibiotics were administered prior to incision. The extremity was then prepped and draped in standard fashion. A time out was performed confirming the correct extremity, correct patient, and correct procedure.   Arthroscopy portals were marked. Local anesthetic was injected to the planned portal sites. The anterolateral portal was established with an 11 blade.      The arthroscope was placed in the anterolateral portal and then into the suprapatellar pouch. Next, the medial portal was established under needle localization. A diagnostic knee scope was completed with the above findings. The medial meniscus tear was identified.   The MCL was pie-crusted to improve visualization of the posterior horn. The meniscal tear was debrided using an arthroscopic biter and an oscillating shaver until the meniscus had stable borders.  A majority of the meniscus rim width was left intact.  A chondroplasty was performed of the medial compartment compartment such that there were stable cartilage edges without any loose fragments of cartilage.  An oscillating shaver was used to debride  the frayed edges of the lateral meniscus body as well.   Arthroscopic fluid was removed from the joint.   The portals were closed with 3-0 Nylon suture. Sterile dressings included Xeroform, 4x4s, Sof-Rol, and Bias wrap. A Polarcare was placed.  The patient was then awakened and taken to the PACU hemodynamically stable without complication.   POSTOPERATIVE PLAN: The patient will be discharged home today once they meet PACU criteria. Aspirin 325 mg daily was prescribed for 2 weeks for DVT prophylaxis.  Physical therapy will start on POD#3-4. Weight-bearing as tolerated. Follow up in 2 weeks per protocol.

## 2021-02-15 NOTE — Transfer of Care (Signed)
Immediate Anesthesia Transfer of Care Note  Patient: Tara Young  Procedure(s) Performed: Left knee arthroscopic partial  medial meniscectomy (Left: Knee)  Patient Location: PACU  Anesthesia Type: General LMA  Level of Consciousness: awake, alert  and patient cooperative  Airway and Oxygen Therapy: Patient Spontanous Breathing and Patient connected to supplemental oxygen  Post-op Assessment: Post-op Vital signs reviewed, Patient's Cardiovascular Status Stable, Respiratory Function Stable, Patent Airway and No signs of Nausea or vomiting  Post-op Vital Signs: Reviewed and stable  Complications: No notable events documented.

## 2021-02-15 NOTE — Anesthesia Procedure Notes (Signed)
Procedure Name: LMA Insertion Date/Time: 02/15/2021 1:30 PM Performed by: Mayme Genta, CRNA Pre-anesthesia Checklist: Patient identified, Emergency Drugs available, Suction available, Timeout performed and Patient being monitored Patient Re-evaluated:Patient Re-evaluated prior to induction Oxygen Delivery Method: Circle system utilized Preoxygenation: Pre-oxygenation with 100% oxygen Induction Type: IV induction LMA: LMA inserted LMA Size: 4.0 Number of attempts: 1 Placement Confirmation: positive ETCO2 and breath sounds checked- equal and bilateral Tube secured with: Tape

## 2021-02-15 NOTE — Anesthesia Postprocedure Evaluation (Signed)
Anesthesia Post Note  Patient: Tara Young  Procedure(s) Performed: Left knee arthroscopic partial  medial meniscectomy (Left: Knee)     Patient location during evaluation: PACU Anesthesia Type: General Level of consciousness: awake and alert Pain management: pain level controlled Vital Signs Assessment: post-procedure vital signs reviewed and stable Respiratory status: spontaneous breathing, nonlabored ventilation, respiratory function stable and patient connected to nasal cannula oxygen Cardiovascular status: blood pressure returned to baseline and stable Postop Assessment: no apparent nausea or vomiting Anesthetic complications: no   No notable events documented.  Trecia Rogers

## 2021-02-15 NOTE — Discharge Instructions (Signed)
Arthroscopic Knee Surgery - Partial Meniscectomy   Post-Op Instructions   1. Bracing or crutches: Crutches will be provided at the time of discharge from the surgery center if you do not already have them.   2. Ice: You may be provided with a device (Polar Care) that allows you to ice the affected area effectively. Otherwise you can ice manually.    3. Driving:  Plan on not driving for at least two weeks. Please note that you are advised NOT to drive while taking narcotic pain medications as you may be impaired and unsafe to drive.   4. Activity: Ankle pumps several times an hour while awake to prevent blood clots. Weight bearing: as tolerated. Use crutches for as needed (usually ~1 week or less) until pain allows you to ambulate without a limp. Bending and straightening the knee is unlimited. Elevate knee above heart level as much as possible for one week. Avoid standing more than 5 minutes (consecutively) for the first week.  Avoid long distance travel for 2 weeks.  5. Medications:  - You have been provided a prescription for narcotic pain medicine. After surgery, take 1-2 narcotic tablets every 4 hours if needed for severe pain.  - You may take up to 3000mg/day of tylenol (acetaminophen). You can take 1000mg 3x/day. Please check your narcotic. If you have acetaminophen in your narcotic (each tablet will be 325mg), be careful not to exceed a total of 3000mg/day of acetaminophen.  - A prescription for anti-nausea medication will be provided in case the narcotic medicine or anesthesia causes nausea - take 1 tablet every 6 hours only if nauseated.  - Take ibuprofen 800 mg every 8 hours WITH food to reduce post-operative knee swelling. DO NOT STOP IBUPROFEN POST-OP UNTIL INSTRUCTED TO DO SO at first post-op office visit (10-14 days after surgery). However, please discontinue if you have any abdominal discomfort after taking this.  - Take enteric coated aspirin 325 mg once daily for 2 weeks to prevent  blood clots.    6. Bandages: The physical therapist should change the bandages at the first post-op appointment. If needed, the dressing supplies have been provided to you.   7. Physical Therapy: 1-2 times per week for 6 weeks. Therapy typically starts on post operative Day 3 or 4. You have been provided an order for physical therapy. The therapist will provide home exercises.   8. Work: May return to full work usually around 2 weeks after 1st post-operative visit. May do light duty/desk job in approximately 1-2 weeks when off of narcotics, pain is well-controlled, and swelling has decreased. Labor intensive jobs may require 4-6 weeks to return.      9. Post-Op Appointments: Your first post-op appointment will be with Dr. Kynzley Dowson in approximately 2 weeks time.    If you find that they have not been scheduled please call the Orthopaedic Appointment front desk at [phone].  

## 2021-02-15 NOTE — Anesthesia Preprocedure Evaluation (Signed)
Anesthesia Evaluation  Patient identified by MRN, date of birth, ID band Patient awake    Reviewed: Allergy & Precautions, H&P , NPO status , Patient's Chart, lab work & pertinent test results, reviewed documented beta blocker date and time   History of Anesthesia Complications (+) PONV and history of anesthetic complications  Airway Mallampati: III  TM Distance: >3 FB Neck ROM: full    Dental no notable dental hx.    Pulmonary asthma ,    Pulmonary exam normal breath sounds clear to auscultation       Cardiovascular Exercise Tolerance: Good negative cardio ROS Normal cardiovascular exam Rhythm:regular Rate:Normal     Neuro/Psych PSYCHIATRIC DISORDERS Anxiety Depression negative neurological ROS     GI/Hepatic Neg liver ROS, GERD  Controlled,  Endo/Other  negative endocrine ROS  Renal/GU negative Renal ROS  negative genitourinary   Musculoskeletal   Abdominal   Peds  Hematology negative hematology ROS (+)   Anesthesia Other Findings   Reproductive/Obstetrics negative OB ROS                             Anesthesia Physical Anesthesia Plan  ASA: 2  Anesthesia Plan: General LMA   Post-op Pain Management:    Induction:   PONV Risk Score and Plan: 3 and Ondansetron, Dexamethasone and Scopolamine patch - Pre-op  Airway Management Planned:   Additional Equipment:   Intra-op Plan:   Post-operative Plan:   Informed Consent: I have reviewed the patients History and Physical, chart, labs and discussed the procedure including the risks, benefits and alternatives for the proposed anesthesia with the patient or authorized representative who has indicated his/her understanding and acceptance.     Dental Advisory Given  Plan Discussed with: CRNA and Anesthesiologist  Anesthesia Plan Comments:         Anesthesia Quick Evaluation

## 2021-02-16 ENCOUNTER — Encounter: Payer: Self-pay | Admitting: Orthopedic Surgery

## 2021-02-17 ENCOUNTER — Other Ambulatory Visit: Payer: Self-pay | Admitting: Family Medicine

## 2021-02-17 DIAGNOSIS — Z1231 Encounter for screening mammogram for malignant neoplasm of breast: Secondary | ICD-10-CM

## 2021-02-23 ENCOUNTER — Ambulatory Visit: Payer: Self-pay | Admitting: Dermatology

## 2021-03-07 ENCOUNTER — Ambulatory Visit: Payer: 59

## 2021-03-28 ENCOUNTER — Encounter: Payer: Self-pay | Admitting: Dermatology

## 2021-03-29 ENCOUNTER — Other Ambulatory Visit: Payer: Self-pay

## 2021-03-29 ENCOUNTER — Ambulatory Visit (INDEPENDENT_AMBULATORY_CARE_PROVIDER_SITE_OTHER): Payer: Self-pay | Admitting: Dermatology

## 2021-03-29 ENCOUNTER — Encounter: Payer: Self-pay | Admitting: Dermatology

## 2021-03-29 DIAGNOSIS — L988 Other specified disorders of the skin and subcutaneous tissue: Secondary | ICD-10-CM

## 2021-03-29 NOTE — Patient Instructions (Signed)

## 2021-03-29 NOTE — Progress Notes (Signed)
   Follow-Up Visit   Subjective  Tara Young is a 55 y.o. female who presents for the following: Facial Elastosis (Face, pt presents for botox today).  The following portions of the chart were reviewed this encounter and updated as appropriate:   Tobacco  Allergies  Meds  Problems  Med Hx  Surg Hx  Fam Hx     Review of Systems:  No other skin or systemic complaints except as noted in HPI or Assessment and Plan.  Objective  Well appearing patient in no apparent distress; mood and affect are within normal limits.  A focused examination was performed including face. Relevant physical exam findings are noted in the Assessment and Plan.  face Rhytides and volume loss.                       Assessment & Plan  Elastosis of skin face Botox 50.5 units injected today to: - Frown complex 25 units - Forehead 7.5 units - Crow's feet 5 units x 2 total of 10 units - DAO's 4 units x 2  Filling material injection - face Location: Frown complex, forehead, bil crow's feet, bil DAO's  Informed consent: Discussed risks (infection, pain, bleeding, bruising, swelling, allergic reaction, paralysis of nearby muscles, eyelid droop, double vision, neck weakness, difficulty breathing, headache, undesirable cosmetic result, and need for additional treatment) and benefits of the procedure, as well as the alternatives.  Informed consent was obtained.  Preparation: The area was cleansed with alcohol.  Procedure Details:  Botox was injected into the dermis with a 30-gauge needle. Pressure applied to any bleeding. Ice packs offered for swelling.  Lot Number:  [id number] Expiration:  12/2022  Total Units Injected:  50.5  Plan: Patient was instructed to remain upright for 4 hours. Patient was instructed to avoid massaging the face and avoid vigorous exercise for the rest of the day. Tylenol may be used for headache.  Allow 2 weeks before returning to clinic for additional  dosing as needed. Patient will call for any problems.  Return for as scheduled for TBSE and Botox f/u.  I, Othelia Pulling, RMA, am acting as scribe for Sarina Ser, MD . Documentation: I have reviewed the above documentation for accuracy and completeness, and I agree with the above.  Sarina Ser, MD

## 2021-04-11 ENCOUNTER — Ambulatory Visit: Payer: 59

## 2021-04-27 ENCOUNTER — Other Ambulatory Visit: Payer: Self-pay

## 2021-04-27 ENCOUNTER — Ambulatory Visit (INDEPENDENT_AMBULATORY_CARE_PROVIDER_SITE_OTHER): Payer: 59 | Admitting: Dermatology

## 2021-04-27 DIAGNOSIS — L988 Other specified disorders of the skin and subcutaneous tissue: Secondary | ICD-10-CM

## 2021-04-27 DIAGNOSIS — L578 Other skin changes due to chronic exposure to nonionizing radiation: Secondary | ICD-10-CM

## 2021-04-27 DIAGNOSIS — L814 Other melanin hyperpigmentation: Secondary | ICD-10-CM

## 2021-04-27 DIAGNOSIS — D229 Melanocytic nevi, unspecified: Secondary | ICD-10-CM | POA: Diagnosis not present

## 2021-04-27 DIAGNOSIS — Z1283 Encounter for screening for malignant neoplasm of skin: Secondary | ICD-10-CM

## 2021-04-27 DIAGNOSIS — Z86018 Personal history of other benign neoplasm: Secondary | ICD-10-CM

## 2021-04-27 DIAGNOSIS — L821 Other seborrheic keratosis: Secondary | ICD-10-CM

## 2021-04-27 DIAGNOSIS — D1800 Hemangioma unspecified site: Secondary | ICD-10-CM

## 2021-04-27 DIAGNOSIS — L918 Other hypertrophic disorders of the skin: Secondary | ICD-10-CM

## 2021-04-27 NOTE — Patient Instructions (Signed)

## 2021-04-27 NOTE — Progress Notes (Signed)
Follow-Up Visit   Subjective  Tara Young is a 55 y.o. female who presents for the following: Annual Exam (Mole check ). Hx of Dysplastic nevus.  The patient presents for Total-Body Skin Exam (TBSE) for skin cancer screening and mole check.  The patient has spots, moles and lesions to be evaluated, some may be new or changing and the patient has concerns that these could be cancer.   The following portions of the chart were reviewed this encounter and updated as appropriate:   Tobacco  Allergies  Meds  Problems  Med Hx  Surg Hx  Fam Hx     Review of Systems:  No other skin or systemic complaints except as noted in HPI or Assessment and Plan.  Objective  Well appearing patient in no apparent distress; mood and affect are within normal limits.  A full examination was performed including scalp, head, eyes, ears, nose, lips, neck, chest, axillae, abdomen, back, buttocks, bilateral upper extremities, bilateral lower extremities, hands, feet, fingers, toes, fingernails, and toenails. All findings within normal limits unless otherwise noted below.  axillae Fleshy, skin-colored pedunculated papules.    Head - Anterior (Face) Rhytides and volume loss.       Assessment & Plan  Skin tag axillae  Acrochordons (Skin Tags) - Fleshy, skin-colored pedunculated papules - Benign appearing.  - Observe. - If desired, they can be removed with an in office procedure that is not covered by insurance. - Please call the clinic if you notice any new or changing lesions.   Elastosis of skin Head - Anterior (Face)  Crows feet 10 units  Bunny lines  5 units   Recommend filler for forehead   Intralesional injection - Head - Anterior (Face) Location: See attached image  Informed consent: Discussed risks (infection, pain, bleeding, bruising, swelling, allergic reaction, paralysis of nearby muscles, eyelid droop, double vision, neck weakness, difficulty breathing, headache,  undesirable cosmetic result, and need for additional treatment) and benefits of the procedure, as well as the alternatives.  Informed consent was obtained.  Preparation: The area was cleansed with alcohol.  Procedure Details:  Botox was injected into the dermis with a 30-gauge needle. Pressure applied to any bleeding. Ice packs offered for swelling.  Lot Number:  [id number] Expiration:  03/25  Total Units Injected:  15  Plan: Patient was instructed to remain upright for 4 hours. Patient was instructed to avoid massaging the face and avoid vigorous exercise for the rest of the day. Tylenol may be used for headache.  Allow 2 weeks before returning to clinic for additional dosing as needed. Patient will call for any problems.   Skin cancer screening  Lentigines - Scattered tan macules - Due to sun exposure - Benign-appearing, observe - Recommend daily broad spectrum sunscreen SPF 30+ to sun-exposed areas, reapply every 2 hours as needed. - Call for any changes  Seborrheic Keratoses - Stuck-on, waxy, tan-brown papules and/or plaques  - Benign-appearing - Discussed benign etiology and prognosis. - Observe - Call for any changes  Melanocytic Nevi - Tan-brown and/or pink-flesh-colored symmetric macules and papules - Benign appearing on exam today - Observation - Call clinic for new or changing moles - Recommend daily use of broad spectrum spf 30+ sunscreen to sun-exposed areas.   Hemangiomas - Red papules - Discussed benign nature - Observe - Call for any changes  Actinic Damage - Chronic condition, secondary to cumulative UV/sun exposure - diffuse scaly erythematous macules with underlying dyspigmentation - Recommend daily broad spectrum sunscreen  SPF 30+ to sun-exposed areas, reapply every 2 hours as needed.  - Staying in the shade or wearing long sleeves, sun glasses (UVA+UVB protection) and wide brim hats (4-inch brim around the entire circumference of the hat) are also  recommended for sun protection.  - Call for new or changing lesions.  History of Dysplastic Nevi Multiple see history - No evidence of recurrence today - Recommend regular full body skin exams - Recommend daily broad spectrum sunscreen SPF 30+ to sun-exposed areas, reapply every 2 hours as needed.  - Call if any new or changing lesions are noted between office visits   Skin cancer screening performed today.   Return in about 1 year (around 04/27/2022) for TBSE, hx of Dysplastic, schedule filler .  IMarye Round, CMA, am acting as scribe for Sarina Ser, MD .  Documentation: I have reviewed the above documentation for accuracy and completeness, and I agree with the above.  Sarina Ser, MD

## 2021-05-06 ENCOUNTER — Encounter: Payer: Self-pay | Admitting: Dermatology

## 2021-08-05 ENCOUNTER — Encounter: Payer: Self-pay | Admitting: Dermatology

## 2021-08-10 ENCOUNTER — Encounter: Payer: Self-pay | Admitting: Dermatology

## 2021-08-11 ENCOUNTER — Ambulatory Visit: Payer: Self-pay | Admitting: Dermatology

## 2022-04-27 ENCOUNTER — Ambulatory Visit: Payer: 59 | Admitting: Dermatology

## 2022-04-27 ENCOUNTER — Encounter: Payer: Self-pay | Admitting: Dermatology

## 2022-04-27 DIAGNOSIS — L821 Other seborrheic keratosis: Secondary | ICD-10-CM

## 2022-04-27 DIAGNOSIS — Z1283 Encounter for screening for malignant neoplasm of skin: Secondary | ICD-10-CM | POA: Diagnosis not present

## 2022-04-27 DIAGNOSIS — B078 Other viral warts: Secondary | ICD-10-CM | POA: Diagnosis not present

## 2022-04-27 DIAGNOSIS — L720 Epidermal cyst: Secondary | ICD-10-CM | POA: Diagnosis not present

## 2022-04-27 DIAGNOSIS — Z86018 Personal history of other benign neoplasm: Secondary | ICD-10-CM | POA: Diagnosis not present

## 2022-04-27 DIAGNOSIS — D229 Melanocytic nevi, unspecified: Secondary | ICD-10-CM

## 2022-04-27 DIAGNOSIS — L578 Other skin changes due to chronic exposure to nonionizing radiation: Secondary | ICD-10-CM

## 2022-04-27 DIAGNOSIS — D485 Neoplasm of uncertain behavior of skin: Secondary | ICD-10-CM

## 2022-04-27 DIAGNOSIS — D225 Melanocytic nevi of trunk: Secondary | ICD-10-CM

## 2022-04-27 DIAGNOSIS — Z79899 Other long term (current) drug therapy: Secondary | ICD-10-CM

## 2022-04-27 DIAGNOSIS — L814 Other melanin hyperpigmentation: Secondary | ICD-10-CM

## 2022-04-27 NOTE — Patient Instructions (Signed)
Wound Care Instructions  Cleanse wound gently with soap and water once a day then pat dry with clean gauze. Apply a thin coat of Petrolatum (petroleum jelly, "Vaseline") over the wound (unless you have an allergy to this). We recommend that you use a new, sterile tube of Vaseline. Do not pick or remove scabs. Do not remove the yellow or white "healing tissue" from the base of the wound.  Cover the wound with fresh, clean, nonstick gauze and secure with paper tape. You may use Band-Aids in place of gauze and tape if the wound is small enough, but would recommend trimming much of the tape off as there is often too much. Sometimes Band-Aids can irritate the skin.  You should call the office for your biopsy report after 1 week if you have not already been contacted.  If you experience any problems, such as abnormal amounts of bleeding, swelling, significant bruising, significant pain, or evidence of infection, please call the office immediately.  FOR ADULT SURGERY PATIENTS: If you need something for pain relief you may take 1 extra strength Tylenol (acetaminophen) AND 2 Ibuprofen (200mg each) together every 4 hours as needed for pain. (do not take these if you are allergic to them or if you have a reason you should not take them.) Typically, you may only need pain medication for 1 to 3 days.     Due to recent changes in healthcare laws, you may see results of your pathology and/or laboratory studies on MyChart before the doctors have had a chance to review them. We understand that in some cases there may be results that are confusing or concerning to you. Please understand that not all results are received at the same time and often the doctors may need to interpret multiple results in order to provide you with the best plan of care or course of treatment. Therefore, we ask that you please give us 2 business days to thoroughly review all your results before contacting the office for clarification. Should  we see a critical lab result, you will be contacted sooner.   If You Need Anything After Your Visit  If you have any questions or concerns for your doctor, please call our main line at [phone] and press option 4 to reach your doctor's medical assistant. If no one answers, please leave a voicemail as directed and we will return your call as soon as possible. Messages left after 4 pm will be answered the following business day.   You may also send us a message via MyChart. We typically respond to MyChart messages within 1-2 business days.  For prescription refills, please ask your pharmacy to contact our office. Our fax number is [phone].  If you have an urgent issue when the clinic is closed that cannot wait until the next business day, you can page your doctor at the number below.    Please note that while we do our best to be available for urgent issues outside of office hours, we are not available 24/7.   If you have an urgent issue and are unable to reach us, you may choose to seek medical care at your doctor's office, retail clinic, urgent care center, or emergency room.  If you have a medical emergency, please immediately call 911 or go to the emergency department.  Pager Numbers  - Dr. Kowalski: [phone]  - Dr. Moye: [phone]  - Dr. Stewart: [phone]  In the event of inclement weather, please call our main line at   [phone] for an update on the status of any delays or closures.  Dermatology Medication Tips: Please keep the boxes that topical medications come in in order to help keep track of the instructions about where and how to use these. Pharmacies typically print the medication instructions only on the boxes and not directly on the medication tubes.   If your medication is too expensive, please contact our office at [phone] option 4 or send us a message through MyChart.   We are unable to tell what your co-pay for medications will be in  advance as this is different depending on your insurance coverage. However, we may be able to find a substitute medication at lower cost or fill out paperwork to get insurance to cover a needed medication.   If a prior authorization is required to get your medication covered by your insurance company, please allow us 1-2 business days to complete this process.  Drug prices often vary depending on where the prescription is filled and some pharmacies may offer cheaper prices.  The website www.goodrx.com contains coupons for medications through different pharmacies. The prices here do not account for what the cost may be with help from insurance (it may be cheaper with your insurance), but the website can give you the price if you did not use any insurance.  - You can print the associated coupon and take it with your prescription to the pharmacy.  - You may also stop by our office during regular business hours and pick up a GoodRx coupon card.  - If you need your prescription sent electronically to a different pharmacy, notify our office through  MyChart or by phone at [phone] option 4.     Si Usted Necesita Algo Despus de Su Visita  Tambin puede enviarnos un mensaje a travs de MyChart. Por lo general respondemos a los mensajes de MyChart en el transcurso de 1 a 2 das hbiles.  Para renovar recetas, por favor pida a su farmacia que se ponga en contacto con nuestra oficina. Nuestro nmero de fax es el [phone].  Si tiene un asunto urgente cuando la clnica est cerrada y que no puede esperar hasta el siguiente da hbil, puede llamar/localizar a su doctor(a) al nmero que aparece a continuacin.   Por favor, tenga en cuenta que aunque hacemos todo lo posible para estar disponibles para asuntos urgentes fuera del horario de oficina, no estamos disponibles las 24 horas del da, los 7 das de la semana.   Si tiene un problema urgente y no puede comunicarse con nosotros, puede  optar por buscar atencin mdica  en el consultorio de su doctor(a), en una clnica privada, en un centro de atencin urgente o en una sala de emergencias.  Si tiene una emergencia mdica, por favor llame inmediatamente al 911 o vaya a la sala de emergencias.  Nmeros de bper  - Dr. Kowalski: [phone]  - Dra. Moye: [phone]  - Dra. Stewart: [phone]  En caso de inclemencias del tiempo, por favor llame a nuestra lnea principal al [phone] para una actualizacin sobre el estado de cualquier retraso o cierre.  Consejos para la medicacin en dermatologa: Por favor, guarde las cajas en las que vienen los medicamentos de uso tpico para ayudarle a seguir las instrucciones sobre dnde y cmo usarlos. Las farmacias generalmente imprimen las instrucciones del medicamento slo en las cajas y no directamente en los tubos del medicamento.   Si su medicamento es muy caro, por favor, pngase en contacto con   nuestra oficina llamando al [phone] y presione la opcin 4 o envenos un mensaje a travs de MyChart.   No podemos decirle cul ser su copago por los medicamentos por adelantado ya que esto es diferente dependiendo de la cobertura de su seguro. Sin embargo, es posible que podamos encontrar un medicamento sustituto a menor costo o llenar un formulario para que el seguro cubra el medicamento que se considera necesario.   Si se requiere una autorizacin previa para que su compaa de seguros cubra su medicamento, por favor permtanos de 1 a 2 das hbiles para completar este proceso.  Los precios de los medicamentos varan con frecuencia dependiendo del lugar de dnde se surte la receta y alguna farmacias pueden ofrecer precios ms baratos.  El sitio web www.goodrx.com tiene cupones para medicamentos de diferentes farmacias. Los precios aqu no tienen en cuenta lo que podra costar con la ayuda del seguro (puede ser ms barato con su seguro), pero el sitio web puede darle el  precio si no utiliz ningn seguro.  - Puede imprimir el cupn correspondiente y llevarlo con su receta a la farmacia.  - Tambin puede pasar por nuestra oficina durante el horario de atencin regular y recoger una tarjeta de cupones de GoodRx.  - Si necesita que su receta se enve electrnicamente a una farmacia diferente, informe a nuestra oficina a travs de MyChart de [place] o por telfono llamando al [phone] y presione la opcin 4.  

## 2022-04-27 NOTE — Progress Notes (Signed)
Follow-Up Visit   Subjective  Tara Young is a 56 y.o. female who presents for the following: Annual Exam (History of dysplastic nevus - The patient presents for Total-Body Skin Exam (TBSE) for skin cancer screening and mole check.  The patient has spots, moles and lesions to be evaluated, some may be new or changing and the patient has concerns that these could be cancer./).  The following portions of the chart were reviewed this encounter and updated as appropriate:   Tobacco  Allergies  Meds  Problems  Med Hx  Surg Hx  Fam Hx     Review of Systems:  No other skin or systemic complaints except as noted in HPI or Assessment and Plan.  Objective  Well appearing patient in no apparent distress; mood and affect are within normal limits.  A full examination was performed including scalp, head, eyes, ears, nose, lips, neck, chest, axillae, abdomen, back, buttocks, bilateral upper extremities, bilateral lower extremities, hands, feet, fingers, toes, fingernails, and toenails. All findings within normal limits unless otherwise noted below.  Left cheek Subcutaneous nodule.   Right mid to low back lat Well healed biopsy site with repigmentation 0.7 x 0.4 cm  Left foot Verrucous papules -- Discussed viral etiology and contagion.    Assessment & Plan   History of Dysplastic Nevi - No evidence of recurrence today - Recommend regular full body skin exams - Recommend daily broad spectrum sunscreen SPF 30+ to sun-exposed areas, reapply every 2 hours as needed.  - Call if any new or changing lesions are noted between office visits  Lentigines - Scattered tan macules - Due to sun exposure - Benign-appearing, observe - Recommend daily broad spectrum sunscreen SPF 30+ to sun-exposed areas, reapply every 2 hours as needed. - Call for any changes  Seborrheic Keratoses - Stuck-on, waxy, tan-brown papules and/or plaques  - Benign-appearing - Discussed benign etiology and  prognosis. - Observe - Call for any changes  Melanocytic Nevi - Tan-brown and/or pink-flesh-colored symmetric macules and papules - Benign appearing on exam today - Observation - Call clinic for new or changing moles - Recommend daily use of broad spectrum spf 30+ sunscreen to sun-exposed areas.   Hemangiomas - Red papules - Discussed benign nature - Observe - Call for any changes  Actinic Damage - Chronic condition, secondary to cumulative UV/sun exposure - diffuse scaly erythematous macules with underlying dyspigmentation - Recommend daily broad spectrum sunscreen SPF 30+ to sun-exposed areas, reapply every 2 hours as needed.  - Staying in the shade or wearing long sleeves, sun glasses (UVA+UVB protection) and wide brim hats (4-inch brim around the entire circumference of the hat) are also recommended for sun protection.  - Call for new or changing lesions.  Skin cancer screening performed today.  Epidermal inclusion cyst Left cheek Akleif cream qd - samples given Small lesion. Chronic and persistent condition with duration or expected duration over one year. Condition is symptomatic / bothersome to patient. Not to goal. Benign-appearing. Exam most consistent with an epidermal inclusion cyst. Discussed that a cyst is a benign growth that can grow over time and sometimes get irritated or inflamed. Recommend observation if it is not bothersome. Discussed option of surgical excision to remove it if it is growing, symptomatic, or other changes noted. Please call for new or changing lesions so they can be evaluated.  Neoplasm of uncertain behavior of skin Right mid to low back lat Epidermal / dermal shaving  Lesion diameter (cm):  0.7 Informed  consent: discussed and consent obtained   Timeout: patient name, date of birth, surgical site, and procedure verified   Procedure prep:  Patient was prepped and draped in usual sterile fashion Prep type:  Isopropyl alcohol Anesthesia: the  lesion was anesthetized in a standard fashion   Anesthetic:  1% lidocaine w/ epinephrine 1-100,000 buffered w/ 8.4% NaHCO3 Instrument used: flexible razor blade   Hemostasis achieved with: pressure, aluminum chloride and electrodesiccation   Outcome: patient tolerated procedure well   Post-procedure details: sterile dressing applied and wound care instructions given   Dressing type: bandage and petrolatum    Specimen 1 - Surgical pathology Differential Diagnosis: Recurrent dysplastic nevus  Check Margins: No [id number]  Other viral warts Left foot Destruction of lesion - Left foot Complexity: simple   Destruction method: cryotherapy   Informed consent: discussed and consent obtained   Timeout:  patient name, date of birth, surgical site, and procedure verified Lesion destroyed using liquid nitrogen: Yes   Region frozen until ice ball extended beyond lesion: Yes   Outcome: patient tolerated procedure well with no complications   Post-procedure details: wound care instructions given    Return in about 1 year (around 04/28/2023) for TBSE.  I, Ashok Cordia, CMA, am acting as scribe for Sarina Ser, MD . Documentation: I have reviewed the above documentation for accuracy and completeness, and I agree with the above.  Sarina Ser, MD

## 2022-05-07 ENCOUNTER — Encounter: Payer: Self-pay | Admitting: Dermatology

## 2022-05-08 ENCOUNTER — Telehealth: Payer: Self-pay

## 2022-05-08 NOTE — Telephone Encounter (Signed)
-----   Message from Ralene Bathe, MD sent at 05/06/2022  1:53 PM EST ----- Diagnosis Skin , right mid to low back lat RESIDUAL DYSPLASTIC NEVUS, MARGIN FREE  Recurrent dysplastic Recheck next visit

## 2022-05-08 NOTE — Telephone Encounter (Signed)
Advised patient of results/hd  

## 2022-06-21 ENCOUNTER — Other Ambulatory Visit: Payer: Self-pay | Admitting: Obstetrics and Gynecology

## 2022-06-21 DIAGNOSIS — R102 Pelvic and perineal pain: Secondary | ICD-10-CM

## 2022-06-28 ENCOUNTER — Ambulatory Visit
Admission: RE | Admit: 2022-06-28 | Discharge: 2022-06-28 | Disposition: A | Discharge status: Home / Self Care | Payer: 59 | Source: Ambulatory Visit | Attending: Obstetrics and Gynecology | Admitting: Obstetrics and Gynecology

## 2022-06-28 DIAGNOSIS — R102 Pelvic and perineal pain: Secondary | ICD-10-CM | POA: Insufficient documentation

## 2022-07-23 ENCOUNTER — Telehealth: Payer: 59 | Admitting: Family

## 2022-07-23 DIAGNOSIS — J019 Acute sinusitis, unspecified: Secondary | ICD-10-CM | POA: Diagnosis not present

## 2022-07-23 MED ORDER — AMOXICILLIN-POT CLAVULANATE 875-125 MG PO TABS: 1.0000 | ORAL_TABLET | Freq: Two times a day (BID) | ORAL | 0 refills | Status: DC

## 2022-07-23 NOTE — Patient Instructions (Signed)

## 2022-07-23 NOTE — Progress Notes (Signed)
Virtual Visit Consent   Tara Young, you are scheduled for a virtual visit with a [facility] provider today. Just as with appointments in the office, your consent must be obtained to participate. Your consent will be active for this visit and any virtual visit you may have with one of our providers in the next 365 days. If you have a MyChart account, a copy of this consent can be sent to you electronically.  As this is a virtual visit, video technology does not allow for your provider to perform a traditional examination. This may limit your provider's ability to fully assess your condition. If your provider identifies any concerns that need to be evaluated in person or the need to arrange testing (such as labs, EKG, etc.), we will make arrangements to do so. Although advances in technology are sophisticated, we cannot ensure that it will always work on either your end or our end. If the connection with a video visit is poor, the visit may have to be switched to a telephone visit. With either a video or telephone visit, we are not always able to ensure that we have a secure connection.  By engaging in this virtual visit, you consent to the provision of healthcare and authorize for your insurance to be billed (if applicable) for the services provided during this visit. Depending on your insurance coverage, you may receive a charge related to this service.  I need to obtain your verbal consent now. Are you willing to proceed with your visit today? Tara Young has provided verbal consent on 07/23/2022 for a virtual visit (video or telephone). Evelina Dun, FNP  Date: 07/23/2022 10:11 AM  Virtual Visit via Video Note   I, Evelina Dun, connected with  Tara Young  ([id number], [birth year]) on 07/23/22 at 10:15 AM EST by a video-enabled telemedicine application and verified that I am speaking with the correct person using two identifiers.  Location: Patient: Virtual Visit  Location Patient: Home Provider: Virtual Visit Location Provider: Home Office   I discussed the limitations of evaluation and management by telemedicine and the availability of in person appointments. The patient expressed understanding and agreed to proceed.    History of Present Illness: Tara Young is a 57 y.o. who identifies as a female who was assigned female at birth, and is being seen today for sinus issues for that last week.  HPI: Sinusitis This is a new problem. The current episode started 1 to 4 weeks ago. There has been no fever. Her pain is at a severity of 7/10. The pain is mild. Associated symptoms include congestion, headaches, sinus pressure and a sore throat. Pertinent negatives include no coughing, ear pain (ear congestion), shortness of breath or sneezing. The treatment provided mild relief.    Problems:  Patient Active Problem List   Diagnosis Date Noted   Major depressive disorder, recurrent, mild ([place]) 12/12/2017   Anxiety disorder 12/12/2017   Insomnia 12/12/2017   PTSD (post-traumatic stress disorder) 08/16/2016   Overweight (BMI 25.0-29.9) 08/09/2016   History of small bowel obstruction 08/09/2016   Mediastinal lymphadenopathy 08/08/2016   Asthma 08/08/2016   Chronic low back pain    Allergic rhinitis    Cervical radiculitis 03/31/2014   Degeneration of intervertebral disc of cervical region 03/20/2014   History of partial hysterectomy 03/20/2014   Status post right oophorectomy 09/08/2008   Grief at loss of child 01/12/2005   Acne 05/01/2003    Allergies:  Allergies  Allergen Reactions  Morphine And Related Rash and Other (See Comments)    Redness and pain in chest   Medications:  Current Outpatient Medications:    omeprazole (PRILOSEC) 20 MG capsule, Take 20 mg by mouth daily., Disp: , Rfl:    traZODone (DESYREL) 50 MG tablet, Take 25-50 mg by mouth at bedtime., Disp: , Rfl:   Observations/Objective: Patient is well-developed,  well-nourished in no acute distress.  Resting comfortably  at home.  Head is normocephalic, atraumatic.  No labored breathing.  Speech is clear and coherent with logical content.  Patient is alert and oriented at baseline.  Nasal congestion  Assessment and Plan: 1. Acute sinusitis, recurrence not specified, unspecified location  - Take meds as prescribed - Use a cool mist humidifier  -Use saline nose sprays frequently -Force fluids -For any cough or congestion  Use plain Mucinex- regular strength or max strength is fine -For fever or aces or pains- take tylenol or ibuprofen. -Throat lozenges if help Follow up if symptoms worsen or do not improve  Evelina Dun, FNP   Follow Up Instructions: I discussed the assessment and treatment plan with the patient. The patient was provided an opportunity to ask questions and all were answered. The patient agreed with the plan and demonstrated an understanding of the instructions.  A copy of instructions were sent to the patient via MyChart unless otherwise noted below.     The patient was advised to call back or seek an in-person evaluation if the symptoms worsen or if the condition fails to improve as anticipated.  Time:  I spent 8 minutes with the patient via telehealth technology discussing the above problems/concerns.    Evelina Dun, FNP

## 2022-10-22 IMAGING — MR MR KNEE*L* W/O CM
7 series · 40 of 40 positions shown · non-contrast
Comparison: None.

CLINICAL DATA: Left knee pain and swelling for approximately 6
months. Possible Baker's cyst.

EXAM:
MRI OF THE LEFT KNEE WITHOUT CONTRAST
TECHNIQUE: Multiplanar, multisequence MR imaging of the knee was performed. No
intravenous contrast was administered.

[Series 15: T2 fat-sat · axial · left · 4.0mm · 0.50mm/px · z∈[-95,+28]mm · 5 of 26 slices shown (1 of 3)]
[im 1/26]
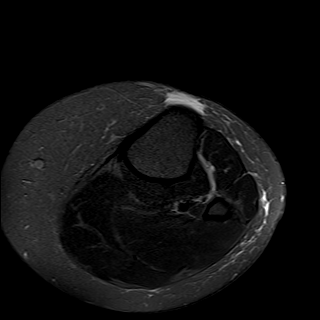
[im 7/26]
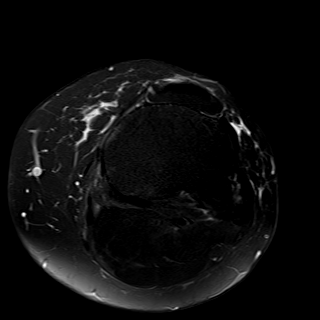
[im 13/26]
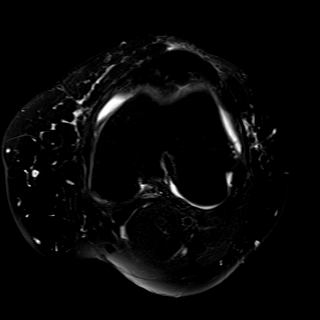
[im 19/26]
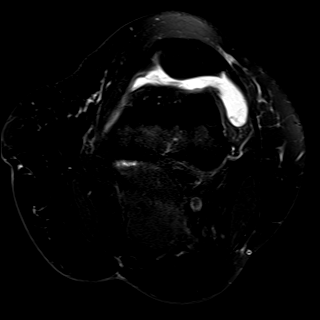
[im 26/26]
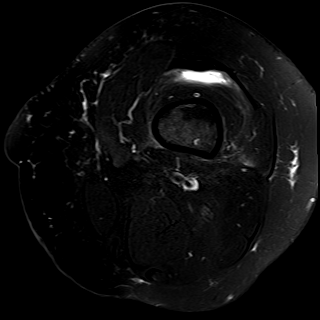

[Series 16: T1 · coronal · left · 4.0mm · 0.47mm/px · 7 of 32 slices shown]
[im 1/32]
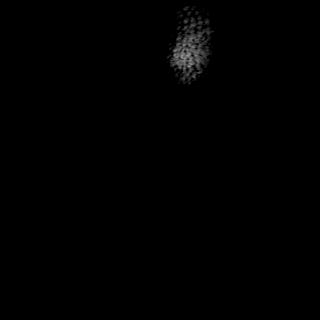
[im 6/32]
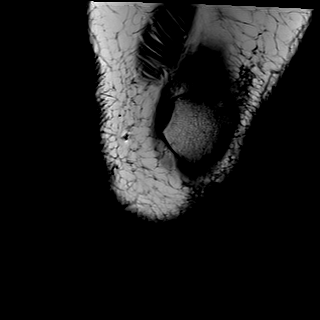
[im 11/32]
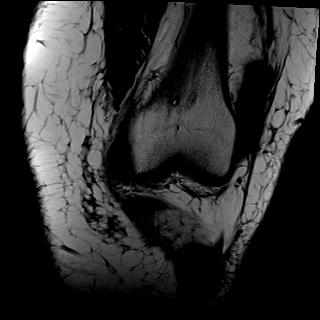
[im 16/32]
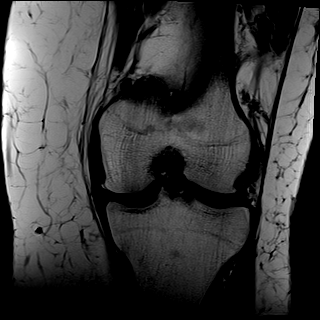
[im 21/32]
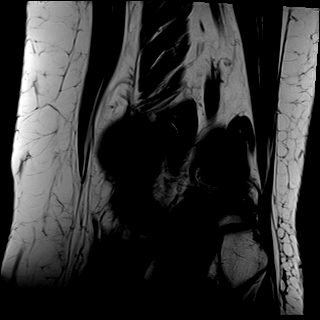
[im 26/32]
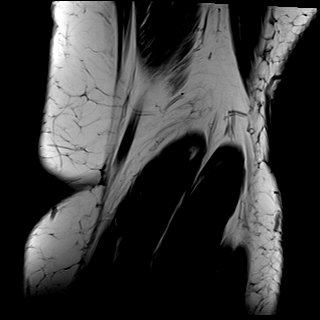
[im 32/32]
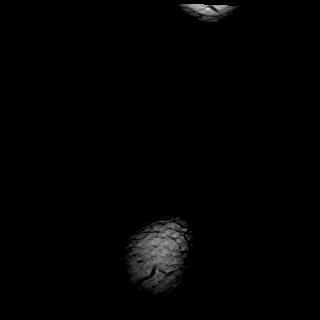

[Series 17: T2 fat-sat · coronal · left · 4.0mm · 0.47mm/px · 7 of 32 slices shown (2 of 3)]
[im 1/32]
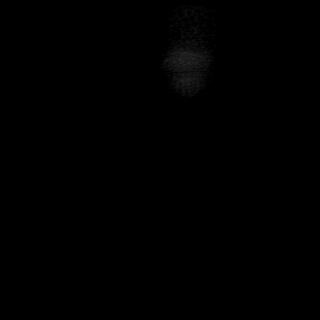
[im 6/32]
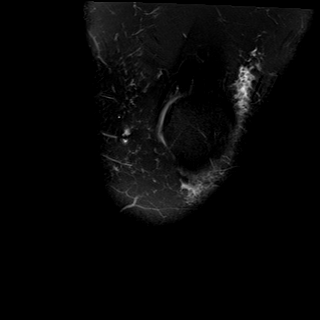
[im 11/32]
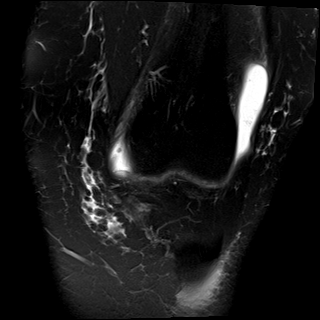
[im 16/32]
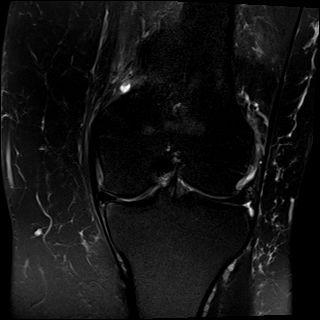
[im 21/32]
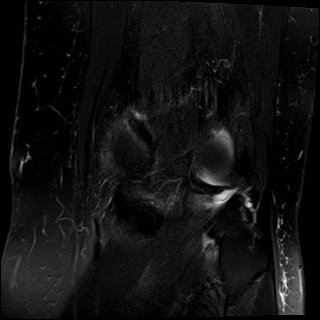
[im 26/32]
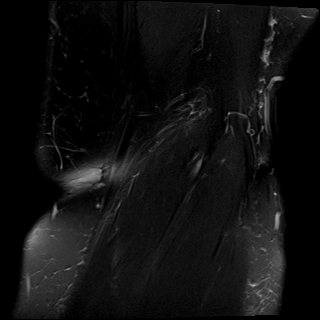
[im 32/32]
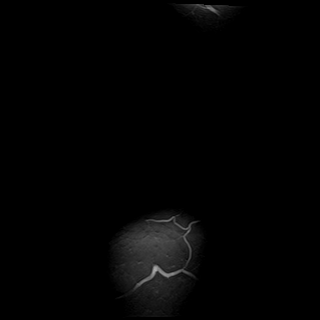

[Series 18: PD fat-sat · coronal · left · 4.0mm · 0.59mm/px · 7 of 32 slices shown (1 of 2)]
[im 1/32]
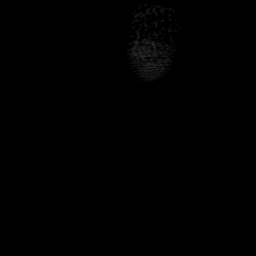
[im 6/32]
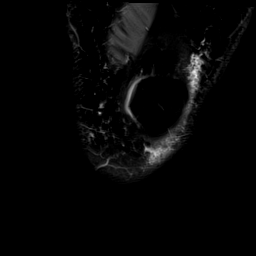
[im 11/32]
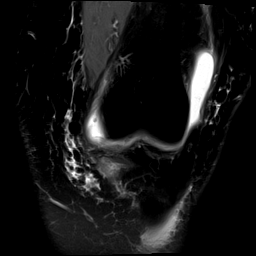
[im 16/32]
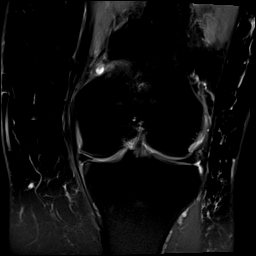
[im 21/32]
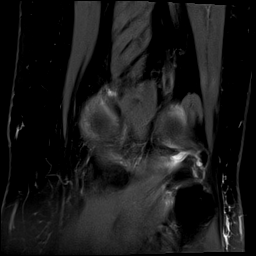
[im 26/32]
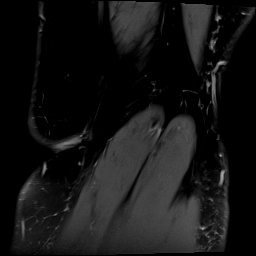
[im 32/32]
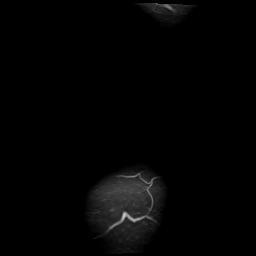

[Series 19: PD fat-sat · sagittal · left · 3.0mm · 0.47mm/px · 6 of 30 slices shown (2 of 2)]
[im 1/30]
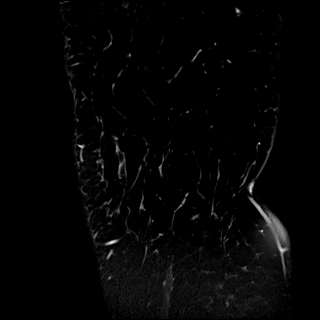
[im 6/30]
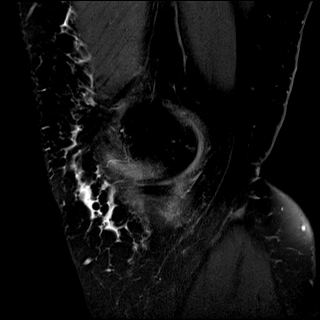
[im 12/30]
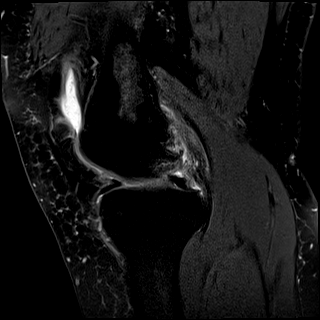
[im 18/30]
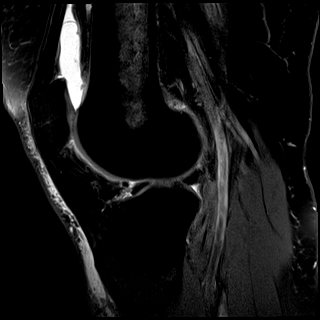
[im 24/30]
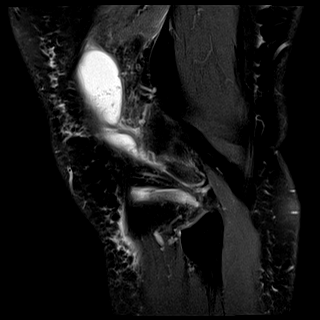
[im 30/30]
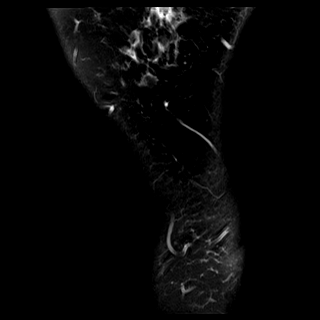

[Series 20: T2 fat-sat · sagittal · left · 3.0mm · 0.47mm/px · 6 of 31 slices shown (3 of 3)]
[im 1/31]
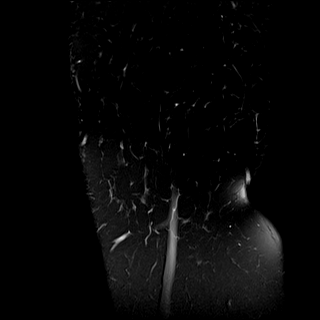
[im 7/31]
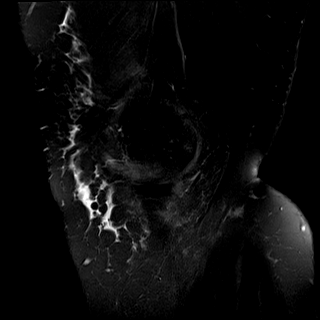
[im 13/31]
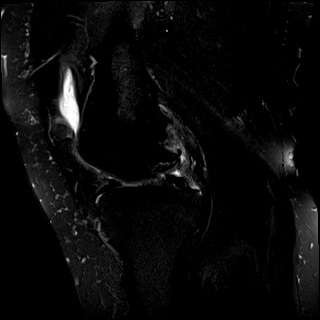
[im 19/31]
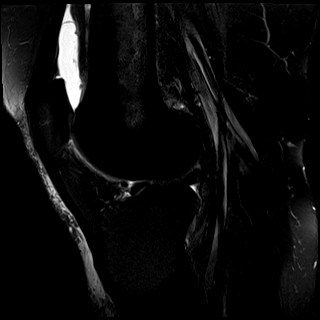
[im 25/31]
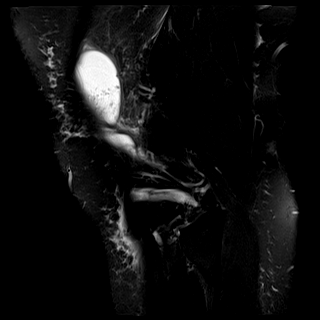
[im 31/31]
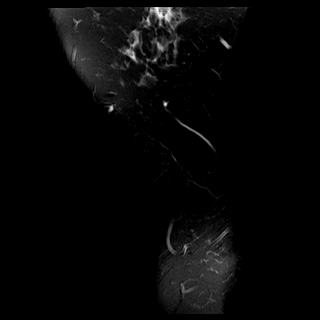

[Series 21: PD · coronal · left · 2.0mm · 0.47mm/px · 2 of 10 slices shown]
[im 1/10]
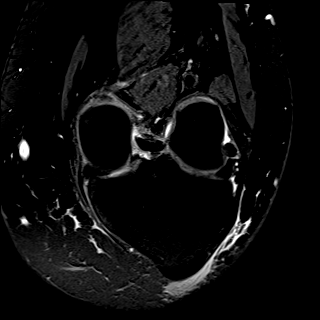
[im 10/10]
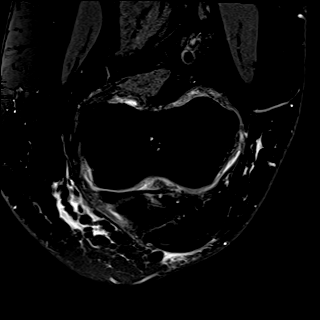

[40 of 40 positions shown; findings below may reference images not displayed]

FINDINGS: MENISCI

Medial meniscus: Intact. Fraying along the free edge of the
posterior horn is identified.

Lateral meniscus: Intact. Mild fraying along the free edge of the
body noted.

LIGAMENTS

Cruciates:  Intact.

Collaterals:  Intact.

CARTILAGE

Patellofemoral: Mild fraying and irregularity of the hyaline
cartilage surface of the patella is most notable surface at the
patellar apex in the midpole.

Medial:  Mildly degenerated.

Lateral:  Mildly degenerated.

Joint:  Moderate joint effusion.

Popliteal Fossa:  No Baker's cyst.

Extensor Mechanism:  Intact.

Bones: No fracture or worrisome lesion. Small osteophytes are seen
about the knee.

Other: None.
IMPRESSION: Negative for meniscal or ligament tear. There is some fraying along
the free edge of the posterior horn of the medial meniscus and body
of the lateral meniscus.

Mild tricompartmental osteoarthritis.

## 2023-05-02 ENCOUNTER — Encounter: Payer: Self-pay | Admitting: Dermatology

## 2023-05-03 ENCOUNTER — Ambulatory Visit: Payer: 59 | Admitting: Dermatology

## 2023-09-15 ENCOUNTER — Ambulatory Visit: Admission: EM | Admit: 2023-09-15 | Discharge: 2023-09-15 | Disposition: A | Discharge status: Home / Self Care

## 2023-09-15 DIAGNOSIS — R220 Localized swelling, mass and lump, head: Secondary | ICD-10-CM | POA: Diagnosis not present

## 2023-09-15 DIAGNOSIS — R21 Rash and other nonspecific skin eruption: Secondary | ICD-10-CM | POA: Diagnosis not present

## 2023-09-15 MED ORDER — PREDNISONE 10 MG (21) PO TBPK: ORAL_TABLET | Freq: Every day | ORAL | 0 refills | Status: DC

## 2023-09-15 MED ORDER — DEXAMETHASONE SODIUM PHOSPHATE 10 MG/ML IJ SOLN
10.0000 mg | Freq: Once | INTRAMUSCULAR | Status: AC
  Administered 2023-09-15: 10 mg via INTRAMUSCULAR

## 2023-09-15 NOTE — Discharge Instructions (Signed)
 Your evaluated for your facial swelling, able to see a faint rash on the skin as well as a puncture mark to the center of the forehead most likely allergic reaction to unknown cause, at this time do not see any signs of infection  You have been given a steroid injection here today in the clinic to stop the inflammatory process which ideally will improve symptoms within the hour  Starting tomorrow take prednisone  pills every morning as directed to continue the above process  You may continue use of your daily Zyrtec with Benadryl as needed  You may use cool compresses over the skin to soothe as needed  Avoid long exposure to heat as this can cause further irritation  If symptoms continue to persist or worsen at any time please follow-up for reevaluation

## 2023-09-15 NOTE — ED Triage Notes (Signed)
 Pt c/o facial swelling along brow and left side of face x1day  Pt states that she was outside mowing her yard and started to have forehead swelling and swelling along the bridge of her nose that moved down the left side of her face.  Pt is unsure if she was bit along her forehead as she was bit on her forearm by a horsefly  Pt states that she has been taking Benadryl and has taken 6 pills since yesterday.

## 2023-09-15 NOTE — ED Provider Notes (Signed)
 Tara Young    CSN: [id number] Arrival date & time: 09/15/23  [id number]      History   Chief Complaint Chief Complaint  Patient presents with   Facial Swelling         HPI Tara Young is a 58 y.o. female.   Patient presents for evaluation of generalized facial swelling, bilateral eye swelling and sensation " thickness" to the throat beginning yesterday after cutting the grass.  Unsure if she was bitten but initially had a knot to the center of the forehead which has improved in size but is still tender to touch.  Swelling initially only to the forehead but has progressed.  Denies shortness of breath, wheezing, cough, difficulty swallowing, visual disturbance, drainage from the eyes.  Has attempted use of Benadryl which has provided minimal improvement.  It is   Past Medical History:  Diagnosis Date   Allergic rhinitis    Anxiety    Cervical radiculitis    Chronic low back pain    DDD (degenerative disc disease), cervical    Dysplastic nevus 01/29/2017   R med breast at [facility] - mild   Dysplastic nevus 12/04/2017   [facility] - excision   Dysplastic nevus 09/23/2018   R mid to low back lat - mild   Dysplastic nevus 09/24/2019   R mid to low back lat - recurrent (shave removal 04/27/22)   Gastritis    GERD (gastroesophageal reflux disease)    PNA (pneumonia)    PONV (postoperative nausea and vomiting)     Patient Active Problem List   Diagnosis Date Noted   Major depressive disorder, recurrent, mild ([facility]) 12/12/2017   Anxiety disorder 12/12/2017   Insomnia 12/12/2017   PTSD (post-traumatic stress disorder) 08/16/2016   Overweight (BMI 25.0-29.9) 08/09/2016   History of small bowel obstruction 08/09/2016   Mediastinal lymphadenopathy 08/08/2016   Asthma 08/08/2016   Chronic low back pain    Allergic rhinitis    Cervical radiculitis 03/31/2014   Degeneration of intervertebral disc of cervical region 03/20/2014   History of partial hysterectomy 03/20/2014    Status post right oophorectomy 09/08/2008   Grief at loss of child 01/12/2005   Acne 05/01/2003    Past Surgical History:  Procedure Laterality Date   ABDOMINAL HYSTERECTOMY     COLONOSCOPY WITH PROPOFOL  N/A 04/24/2016   Procedure: COLONOSCOPY WITH PROPOFOL ;  Surgeon: Cassie Click, MD;  Location[facility] ENDOSCOPY;  Service: Endoscopy;  Laterality: N/A;   CYSTOSCOPY  05/05/2016   Procedure: CYSTOSCOPY;  Surgeon: Carolynn Citrin, MD;  Location: [facility] ORS;  Service: Gynecology;;   ESOPHAGOGASTRODUODENOSCOPY (EGD) WITH PROPOFOL  N/A 04/24/2016   Procedure: ESOPHAGOGASTRODUODENOSCOPY (EGD) WITH PROPOFOL ;  Surgeon: Cassie Click, MD;  Location: [facility] ENDOSCOPY;  Service: Endoscopy;  Laterality: N/A;   KNEE ARTHROSCOPY WITH MEDIAL MENISECTOMY Left 02/15/2021   Procedure: Left knee arthroscopic partial  medial meniscectomy;  Surgeon: Lorri Rota, MD;  Location: [facility];  Service: Orthopedics;  Laterality: Left;   LAPAROSCOPIC LYSIS OF ADHESIONS  05/05/2016   Procedure: LAPAROSCOPIC LYSIS OF ADHESIONS, EXTENSIVE PELVIC AND ABDOMINAL;  Surgeon: Carolynn Citrin, MD;  Location: [facility] ORS;  Service: Gynecology;;   LAPAROSCOPIC LYSIS OF ADHESIONS  05/05/2016   Procedure: LAPAROSCOPIC LYSIS OF ADHESIONS;  Surgeon: Kandis Ormond, MD;  Location: [facility] ORS;  Service: General;;   LAPAROSCOPIC OVARIAN CYSTECTOMY Right 05/05/2016   Procedure: LAPAROSCOPIC EXCISION RIGHT ADNEXAL CYST;  Surgeon: Carolynn Citrin, MD;  Location: [facility] ORS;  Service: Gynecology;  Laterality:  Right;   LAPAROSCOPIC SALPINGO OOPHERECTOMY Left 05/05/2016   Procedure: LAPAROSCOPIC SALPINGO OOPHORECTOMY;  Surgeon: Carolynn Citrin, MD;  Location: [facility] ORS;  Service: Gynecology;  Laterality: Left;   RIGHT OOPHORECTOMY     TUBAL LIGATION      OB History   No obstetric history on file.      Home Medications    Prior to Admission medications   Medication Sig Start Date End Date Taking?  Authorizing Provider  DULoxetine (CYMBALTA) 20 MG capsule  05/22/21  Yes [provider]  metFORMIN (GLUCOPHAGE-XR) 500 MG 24 hr tablet Take 500 mg by mouth. 05/11/23 05/10/24 Yes [provider]  omeprazole (PRILOSEC) 20 MG capsule Take 20 mg by mouth daily.   Yes [provider]  predniSONE  (STERAPRED UNI-PAK 21 TAB) 10 MG (21) TBPK tablet Take by mouth daily. Take 6 tabs by mouth daily  for 1 days, then 5 tabs for 1 days, then 4 tabs for 1 days, then 3 tabs for 1 days, 2 tabs for 1 days, then 1 tab by mouth daily for 1 days 09/15/23  Yes Braylan Faul R, NP  traZODone (DESYREL) 50 MG tablet Take 25-50 mg by mouth at bedtime.   Yes [provider]  amoxicillin -clavulanate (AUGMENTIN ) 875-125 MG tablet Take 1 tablet by mouth 2 (two) times daily. 07/23/22   Yevette Hem, FNP    Family History Family History  Problem Relation Age of Onset   Cancer Father        stomach   Lymphoma Father    Fibromyalgia Mother    Stroke Mother    Cancer Mother     Social History Social History   Tobacco Use   Smoking status: Never   Smokeless tobacco: Never  Vaping Use   Vaping status: Never Used  Substance Use Topics   Alcohol use: No   Drug use: No     Allergies   Morphine and codeine   Review of Systems Review of Systems   Physical Exam Triage Vital Signs ED Triage Vitals  Encounter Vitals Group     BP 09/15/23 0930 129/85     Systolic BP Percentile --      Diastolic BP Percentile --      Pulse Rate 09/15/23 0930 90     Resp --      Temp 09/15/23 0930 99 F (37.2 C)     Temp Source 09/15/23 0930 Oral     SpO2 09/15/23 0930 96 %     Weight 09/15/23 0932 199 lb (90.3 kg)     Height 09/15/23 0932 5\' 6"  (1.676 m)     Head Circumference --      Peak Flow --      Pain Score 09/15/23 0931 6     Pain Loc --      Pain Education --      Exclude from Growth Chart --    No data found.  Updated Vital Signs BP 129/85 (BP Location: Left Arm)    Pulse 90   Temp 99 F (37.2 C) (Oral)   Ht 5\' 6"  (1.676 m)   Wt 199 lb (90.3 kg)   SpO2 96%   BMI 32.12 kg/m   Visual Acuity Right Eye Distance:   Left Eye Distance:   Bilateral Distance:    Right Eye Near:   Left Eye Near:    Bilateral Near:     Physical Exam Constitutional:      Appearance: Normal appearance.  Eyes:  Extraocular Movements: Extraocular movements intact.  Skin:    Comments: Generalized facial swelling, mild swelling to the bilateral periorbital without edema or drainage, less than 0.5 cm puncture present to the center of the forehead, tender to palpation but no erythema or drainage expelled from the site, faint erythematous macular rash generalized to the face  Neurological:     Mental Status: She is alert.      UC Treatments / Results  Labs (all labs ordered are listed, but only abnormal results are displayed) Labs Reviewed - No data to display  EKG   Radiology No results found.  Procedures Procedures (including critical care time)  Medications Ordered in UC Medications  dexamethasone  (DECADRON ) injection 10 mg (has no administration in time range)    Initial Impression / Assessment and Plan / UC Course  I have reviewed the triage vital signs and the nursing notes.  Pertinent labs & imaging results that were available during my care of the patient were reviewed by me and considered in my medical decision making (see chart for details).  Facial swelling, rash  Rash appears inflammatory, no signs of infection at this time, no respiratory involvement, stable, Decadron  IM given and prescribed prednisone  taper and discussed administration, recommended over-the-counter medication and nonpharmacological supportive measures, may follow-up of the symptoms persist or worsen Final Clinical Impressions(s) / UC Diagnoses   Final diagnoses:  Facial swelling  Rash and nonspecific skin eruption     Discharge Instructions      Your evaluated  for your facial swelling, able to see a faint rash on the skin as well as a puncture mark to the center of the forehead most likely allergic reaction to unknown cause, at this time do not see any signs of infection  You have been given a steroid injection here today in the clinic to stop the inflammatory process which ideally will improve symptoms within the hour  Starting tomorrow take prednisone  pills every morning as directed to continue the above process  You may continue use of your daily Zyrtec with Benadryl as needed  You may use cool compresses over the skin to soothe as needed  Avoid long exposure to heat as this can cause further irritation  If symptoms continue to persist or worsen at any time please follow-up for reevaluation   ED Prescriptions     Medication Sig Dispense Auth. Provider   predniSONE  (STERAPRED UNI-PAK 21 TAB) 10 MG (21) TBPK tablet Take by mouth daily. Take 6 tabs by mouth daily  for 1 days, then 5 tabs for 1 days, then 4 tabs for 1 days, then 3 tabs for 1 days, 2 tabs for 1 days, then 1 tab by mouth daily for 1 days 21 tablet Frayda Egley, Maybelle Spatz, NP      PDMP not reviewed this encounter.   Reena Canning, [place] 09/15/23 [phone]

## 2023-09-27 ENCOUNTER — Ambulatory Visit: Payer: Self-pay | Admitting: Dermatology

## 2023-11-05 ENCOUNTER — Ambulatory Visit: Payer: Self-pay | Admitting: Dermatology

## 2023-11-05 ENCOUNTER — Encounter: Payer: Self-pay | Admitting: Dermatology

## 2023-11-05 DIAGNOSIS — L821 Other seborrheic keratosis: Secondary | ICD-10-CM

## 2023-11-05 DIAGNOSIS — W908XXA Exposure to other nonionizing radiation, initial encounter: Secondary | ICD-10-CM

## 2023-11-05 DIAGNOSIS — D1801 Hemangioma of skin and subcutaneous tissue: Secondary | ICD-10-CM

## 2023-11-05 DIAGNOSIS — L820 Inflamed seborrheic keratosis: Secondary | ICD-10-CM | POA: Diagnosis not present

## 2023-11-05 DIAGNOSIS — L814 Other melanin hyperpigmentation: Secondary | ICD-10-CM

## 2023-11-05 DIAGNOSIS — Z86018 Personal history of other benign neoplasm: Secondary | ICD-10-CM

## 2023-11-05 DIAGNOSIS — L918 Other hypertrophic disorders of the skin: Secondary | ICD-10-CM

## 2023-11-05 DIAGNOSIS — Z1283 Encounter for screening for malignant neoplasm of skin: Secondary | ICD-10-CM | POA: Diagnosis not present

## 2023-11-05 DIAGNOSIS — L578 Other skin changes due to chronic exposure to nonionizing radiation: Secondary | ICD-10-CM | POA: Diagnosis not present

## 2023-11-05 DIAGNOSIS — D229 Melanocytic nevi, unspecified: Secondary | ICD-10-CM

## 2023-11-05 NOTE — Patient Instructions (Addendum)

## 2023-11-05 NOTE — Progress Notes (Signed)
 Follow-Up Visit   Subjective  Tara Young is a 58 y.o. female who presents for the following: Skin Cancer Screening and Full Body Skin Exam Hx of dysplastic nevi   The patient presents for Total-Body Skin Exam (TBSE) for skin cancer screening and mole check. The patient has spots, moles and lesions to be evaluated, some may be new or changing and the patient may have concern these could be cancer.  The following portions of the chart were reviewed this encounter and updated as appropriate: medications, allergies, medical history  Review of Systems:  No other skin or systemic complaints except as noted in HPI or Assessment and Plan.  Objective  Well appearing patient in no apparent distress; mood and affect are within normal limits.  A full examination was performed including scalp, head, eyes, ears, nose, lips, neck, chest, axillae, abdomen, back, buttocks, bilateral upper extremities, bilateral lower extremities, hands, feet, fingers, toes, fingernails, and toenails. All findings within normal limits unless otherwise noted below.   Relevant physical exam findings are noted in the Assessment and Plan.  Left Axilla x 7, right axilla x 1, abdomen x 1 (9) Erythematous stuck-on, waxy papule or plaque  Assessment & Plan   SKIN CANCER SCREENING PERFORMED TODAY.  ACTINIC DAMAGE - Chronic condition, secondary to cumulative UV/sun exposure - diffuse scaly erythematous macules with underlying dyspigmentation - Recommend daily broad spectrum sunscreen SPF 30+ to sun-exposed areas, reapply every 2 hours as needed.  - Staying in the shade or wearing long sleeves, sun glasses (UVA+UVB protection) and wide brim hats (4-inch brim around the entire circumference of the hat) are also recommended for sun protection.  - Call for new or changing lesions.  LENTIGINES, SEBORRHEIC KERATOSES, HEMANGIOMAS - Benign normal skin lesions - Benign-appearing - Call for any changes Sks - under  arm  MELANOCYTIC NEVI - Tan-brown and/or pink-flesh-colored symmetric macules and papules - Benign appearing on exam today - Observation - Call clinic for new or changing moles - Recommend daily use of broad spectrum spf 30+ sunscreen to sun-exposed areas.   Acrochordons (Skin Tags) - Fleshy, skin-colored pedunculated papules - Benign appearing.  - Observe. - If desired, they can be removed with an in office procedure that is not covered by insurance. - Please call the clinic if you notice any new or changing lesions.  HISTORY OF DYSPLASTIC NEVUS 09/24/2019 Right mid to low back lateral recurrent shv removal 04/27/2022 09/23/2018 right mid to low back lateral mild 12/04/2016 right medial breast at [facility] - mild 12/04/2017 - LUQA - excision No evidence of recurrence today Recommend regular full body skin exams Recommend daily broad spectrum sunscreen SPF 30+ to sun-exposed areas, reapply every 2 hours as needed.  Call if any new or changing lesions are noted between office visits  INFLAMED SEBORRHEIC KERATOSIS (9) Left Axilla x 7, right axilla x 1, abdomen x 1 (9) Symptomatic, irritating, patient would like treated. Destruction of lesion - Left Axilla x 7, right axilla x 1, abdomen x 1 (9) Complexity: simple   Destruction method: cryotherapy   Informed consent: discussed and consent obtained   Timeout:  patient name, date of birth, surgical site, and procedure verified Lesion destroyed using liquid nitrogen: Yes   Region frozen until ice ball extended beyond lesion: Yes   Outcome: patient tolerated procedure well with no complications   Post-procedure details: wound care instructions given   Return in about 1 year (around 11/04/2024) for TBSE.  Laurelyn Ponder, CMA, am acting as  scribe for Celine Collard, MD.  Documentation: I have reviewed the above documentation for accuracy and completeness, and I agree with the above.  Celine Collard, MD

## 2023-12-06 ENCOUNTER — Ambulatory Visit
Admission: RE | Admit: 2023-12-06 | Discharge: 2023-12-06 | Disposition: A | Discharge status: Home / Self Care | Payer: Self-pay

## 2023-12-06 VITALS — BP 130/85 | HR 90 | Temp 97.8°F | Resp 18

## 2023-12-06 DIAGNOSIS — R21 Rash and other nonspecific skin eruption: Secondary | ICD-10-CM | POA: Diagnosis not present

## 2023-12-06 MED ORDER — CLOTRIMAZOLE-BETAMETHASONE 1-0.05 % EX CREA: TOPICAL_CREAM | CUTANEOUS | 1 refills | Status: DC

## 2023-12-06 MED ORDER — DEXAMETHASONE SODIUM PHOSPHATE 10 MG/ML IJ SOLN
10.0000 mg | Freq: Once | INTRAMUSCULAR | Status: AC
  Administered 2023-12-06: 10 mg via INTRAMUSCULAR

## 2023-12-06 NOTE — Discharge Instructions (Addendum)
 You were given an injection of dexamethasone  today.    Use the clotrimazole -betamethasone  cream as directed.    Follow-up with your primary care provider if your symptoms are not improving.

## 2023-12-06 NOTE — ED Triage Notes (Signed)
 Patient to Urgent Care with complaints of an itchy rash to her waist band and along the right side of her jaw.   Symptoms x1 week. Denies any new product usage,   Taking zyrtec.

## 2023-12-06 NOTE — ED Provider Notes (Signed)
 [name]    CSN: [id number] Arrival date & time: 12/06/23  0820      History   Chief Complaint Chief Complaint  Patient presents with   Allergic Reaction    Entered by patient    HPI Tara Young is a 58 y.o. female.  Patient presents with 1 week history of pruritic rash in her waist area on both sides and on the right side of her neck.  She treated it with a prednisone  taper that she had leftover from a previous illness.  She finished the prednisone  taper on 12/02/2023 and did not notice any improvement.  She has been taking Benadryl at night and using Benadryl cream.  She also takes Zyrtec nightly for allergies.  Patient frequently works outside in her yard and thought the rash was likely contact dermatitis.  She denies fever or purulent drainage.  Patient was seen at this urgent care on 09/15/2023 for facial swelling and rash; treated with dexamethasone  injection and prescribed prednisone  taper which she did not take at that time but took with this current rash.  She is followed by dermatology for various skin issues; last seen 11/05/2023.  The history is provided by the patient and medical records.    Past Medical History:  Diagnosis Date   Allergic rhinitis    Anxiety    Cervical radiculitis    Chronic low back pain    DDD (degenerative disc disease), cervical    Dysplastic nevus 01/29/2017   R med breast at [facility] - mild   Dysplastic nevus 12/04/2017   LUQA - excision   Dysplastic nevus 09/23/2018   R mid to low back lat - mild   Dysplastic nevus 09/24/2019   R mid to low back lat - recurrent (shave removal 04/27/22)   Gastritis    GERD (gastroesophageal reflux disease)    PNA (pneumonia)    PONV (postoperative nausea and vomiting)     Patient Active Problem List   Diagnosis Date Noted   Major depressive disorder, recurrent, mild (HCC) 12/12/2017   Anxiety disorder 12/12/2017   Insomnia 12/12/2017   PTSD (post-traumatic stress disorder)  08/16/2016   Overweight (BMI 25.0-29.9) 08/09/2016   History of small bowel obstruction 08/09/2016   Mediastinal lymphadenopathy 08/08/2016   Asthma 08/08/2016   Chronic low back pain    Allergic rhinitis    Cervical radiculitis 03/31/2014   Degeneration of intervertebral disc of cervical region 03/20/2014   History of partial hysterectomy 03/20/2014   Status post right oophorectomy 09/08/2008   Grief at loss of child 01/12/2005   Acne 05/01/2003    Past Surgical History:  Procedure Laterality Date   ABDOMINAL HYSTERECTOMY     COLONOSCOPY WITH PROPOFOL  N/A 04/24/2016   Procedure: COLONOSCOPY WITH PROPOFOL ;  Surgeon: Lamar ONEIDA Holmes, MD;  Location: [facility] ENDOSCOPY;  Service: Endoscopy;  Laterality: N/A;   CYSTOSCOPY  05/05/2016   Procedure: CYSTOSCOPY;  Surgeon: Debby JINNY Dinsmore, MD;  Location: [facility] ORS;  Service: Gynecology;;   ESOPHAGOGASTRODUODENOSCOPY (EGD) WITH PROPOFOL  N/A 04/24/2016   Procedure: ESOPHAGOGASTRODUODENOSCOPY (EGD) WITH PROPOFOL ;  Surgeon: Lamar ONEIDA Holmes, MD;  Location: [facility] ENDOSCOPY;  Service: Endoscopy;  Laterality: N/A;   KNEE ARTHROSCOPY WITH MEDIAL MENISECTOMY Left 02/15/2021   Procedure: Left knee arthroscopic partial  medial meniscectomy;  Surgeon: Tobie Priest, MD;  Location: [facility];  Service: Orthopedics;  Laterality: Left;   LAPAROSCOPIC LYSIS OF ADHESIONS  05/05/2016   Procedure: LAPAROSCOPIC LYSIS OF ADHESIONS, EXTENSIVE PELVIC AND ABDOMINAL;  Surgeon: Debby  JINNY Dinsmore, MD;  Location: [facility] ORS;  Service: Gynecology;;   LAPAROSCOPIC LYSIS OF ADHESIONS  05/05/2016   Procedure: LAPAROSCOPIC LYSIS OF ADHESIONS;  Surgeon: Dorothyann LITTIE Husk, MD;  Location: [facility] ORS;  Service: General;;   LAPAROSCOPIC OVARIAN CYSTECTOMY Right 05/05/2016   Procedure: LAPAROSCOPIC EXCISION RIGHT ADNEXAL CYST;  Surgeon: Debby JINNY Dinsmore, MD;  Location: [facility] ORS;  Service: Gynecology;  Laterality: Right;   LAPAROSCOPIC SALPINGO OOPHERECTOMY Left  05/05/2016   Procedure: LAPAROSCOPIC SALPINGO OOPHORECTOMY;  Surgeon: Debby JINNY Dinsmore, MD;  Location: [facility] ORS;  Service: Gynecology;  Laterality: Left;   RIGHT OOPHORECTOMY     TUBAL LIGATION      OB History   No obstetric history on file.      Home Medications    Prior to Admission medications   Medication Sig Start Date End Date Taking? Authorizing Provider  clotrimazole -betamethasone  (LOTRISONE ) cream Apply to affected area 2 times daily 12/06/23  Yes Corlis Burnard DEL, NP  MOUNJARO 5 MG/0.5ML Pen Inject 5 mg into the skin. 12/05/23  Yes [provider]  amoxicillin -clavulanate (AUGMENTIN ) 875-125 MG tablet Take 1 tablet by mouth 2 (two) times daily. Patient not taking: Reported on 12/06/2023 07/23/22   Lavell Bari LABOR, FNP  DULoxetine (CYMBALTA) 20 MG capsule  05/22/21   [provider]  metFORMIN (GLUCOPHAGE-XR) 500 MG 24 hr tablet Take 500 mg by mouth. 05/11/23 05/10/24  [provider]  omeprazole (PRILOSEC) 20 MG capsule Take 20 mg by mouth daily.    [provider]  pantoprazole (PROTONIX) 40 MG tablet Take 40 mg by mouth daily.    [provider]  predniSONE  (STERAPRED UNI-PAK 21 TAB) 10 MG (21) TBPK tablet Take by mouth daily. Take 6 tabs by mouth daily  for 1 days, then 5 tabs for 1 days, then 4 tabs for 1 days, then 3 tabs for 1 days, 2 tabs for 1 days, then 1 tab by mouth daily for 1 days Patient not taking: Reported on 12/06/2023 09/15/23   [name], NP  traZODone (DESYREL) 50 MG tablet Take 25-50 mg by mouth at bedtime.    [provider]    Family History Family History  Problem Relation Age of Onset   Cancer Father        stomach   Lymphoma Father    Fibromyalgia Mother    Stroke Mother    Cancer Mother     Social History Social History   Tobacco Use   Smoking status: Never   Smokeless tobacco: Never  Vaping Use   Vaping status: Never Used  Substance Use Topics   Alcohol use: No   Drug use:  No     Allergies   Morphine and codeine   Review of Systems Review of Systems  Constitutional:  Negative for chills and fever.  HENT:  Negative for sore throat and trouble swallowing.   Respiratory:  Negative for cough and shortness of breath.   Skin:  Positive for color change and rash.     Physical Exam Triage Vital Signs ED Triage Vitals  Encounter Vitals Group     BP      Girls Systolic BP Percentile      Girls Diastolic BP Percentile      Boys Systolic BP Percentile      Boys Diastolic BP Percentile      Pulse      Resp      Temp      Temp src  SpO2      Weight      Height      Head Circumference      Peak Flow      Pain Score      Pain Loc      Pain Education      Exclude from Growth Chart    No data found.  Updated Vital Signs BP 130/85   Pulse 90   Temp 97.8 F (36.6 C)   Resp 18   SpO2 99%   Visual Acuity Right Eye Distance:   Left Eye Distance:   Bilateral Distance:    Right Eye Near:   Left Eye Near:    Bilateral Near:     Physical Exam Constitutional:      General: She is not in acute distress. HENT:     Mouth/Throat:     Mouth: Mucous membranes are moist.  Cardiovascular:     Rate and Rhythm: Normal rate and regular rhythm.  Pulmonary:     Effort: Pulmonary effort is normal. No respiratory distress.  Skin:    General: Skin is warm and dry.     Findings: Rash present.     Comments: Patchy erythematous rash on bilateral lower abdomen and skin fold.  Patchy erythematous rash on right upper neck.  No drainage.  Neurological:     Mental Status: She is alert.      UC Treatments / Results  Labs (all labs ordered are listed, but only abnormal results are displayed) Labs Reviewed - No data to display  EKG   Radiology No results found.  Procedures Procedures (including critical care time)  Medications Ordered in UC Medications  dexamethasone  (DECADRON ) injection 10 mg (10 mg Intramuscular Given 12/06/23 0857)     Initial Impression / Assessment and Plan / UC Course  I have reviewed the triage vital signs and the nursing notes.  Pertinent labs & imaging results that were available during my care of the patient were reviewed by me and considered in my medical decision making (see chart for details).    Rash.  Afebrile and vital signs are stable.  Dexamethasone  injection given here as patient reports this worked well for her in the past.  She just completed a prednisone  taper so this will not be prescribed again today.  Treating rash with clotrimazole -betamethasone  cream.  Education provided on adult rash.  Instructed patient to follow-up with her PCP if she is not improving.  She agrees to plan of care.  Final Clinical Impressions(s) / UC Diagnoses   Final diagnoses:  Rash     Discharge Instructions      You were given an injection of dexamethasone  today.    Use the clotrimazole -betamethasone  cream as directed.    Follow-up with your primary care provider if your symptoms are not improving.      ED Prescriptions     Medication Sig Dispense Auth. Provider   clotrimazole -betamethasone  (LOTRISONE ) cream Apply to affected area 2 times daily 15 g Corlis Burnard DEL, NP      PDMP not reviewed this encounter.   Corlis Burnard DEL, NP 12/06/23 [phone]

## 2024-02-24 ENCOUNTER — Ambulatory Visit
Admission: EM | Admit: 2024-02-24 | Discharge: 2024-02-24 | Disposition: A | Discharge status: Home / Self Care | Attending: Emergency Medicine | Admitting: Emergency Medicine

## 2024-02-24 DIAGNOSIS — L259 Unspecified contact dermatitis, unspecified cause: Secondary | ICD-10-CM

## 2024-02-24 MED ORDER — DEXAMETHASONE SODIUM PHOSPHATE 10 MG/ML IJ SOLN
10.0000 mg | Freq: Once | INTRAMUSCULAR | Status: AC
  Administered 2024-02-24: 10 mg via INTRAMUSCULAR

## 2024-02-24 NOTE — ED Provider Notes (Signed)
 Tara Young    CSN: [id number] Arrival date & time: 02/24/24  0913      History   Chief Complaint Chief Complaint  Patient presents with   Rash    HPI Tara Young is a 58 y.o. female.  Patient presents with pruritic rash on her right upper eyelid, behind her right ear, on her right forearm, on her left hand x 2 days.  Her symptoms started after she was doing yard work and she believes are caused by coming in contact with a plant allergens such as poison oak.  She has been treating her symptoms with Benadryl.  She states in the past she has needed a injectable steroid such as dexamethasone  for relief of her symptoms.  No lesions within her eyes or mouth.  No fever, chest pain, shortness of breath, cough.  The history is provided by the patient and medical records.    Past Medical History:  Diagnosis Date   Allergic rhinitis    Anxiety    Cervical radiculitis    Chronic low back pain    DDD (degenerative disc disease), cervical    Dysplastic nevus 01/29/2017   R med breast at [facility] - mild   Dysplastic nevus 12/04/2017   LUQA - excision   Dysplastic nevus 09/23/2018   R mid to low back lat - mild   Dysplastic nevus 09/24/2019   R mid to low back lat - recurrent (shave removal 04/27/22)   Gastritis    GERD (gastroesophageal reflux disease)    PNA (pneumonia)    PONV (postoperative nausea and vomiting)     Patient Active Problem List   Diagnosis Date Noted   Major depressive disorder, recurrent, mild 12/12/2017   Anxiety disorder 12/12/2017   Insomnia 12/12/2017   PTSD (post-traumatic stress disorder) 08/16/2016   Overweight (BMI 25.0-29.9) 08/09/2016   History of small bowel obstruction 08/09/2016   Mediastinal lymphadenopathy 08/08/2016   Asthma 08/08/2016   Chronic low back pain    Allergic rhinitis    Cervical radiculitis 03/31/2014   Degeneration of intervertebral disc of cervical region 03/20/2014   History of partial hysterectomy  03/20/2014   Status post right oophorectomy 09/08/2008   Grief at loss of child 01/12/2005   Acne 05/01/2003    Past Surgical History:  Procedure Laterality Date   ABDOMINAL HYSTERECTOMY     COLONOSCOPY WITH PROPOFOL  N/A 04/24/2016   Procedure: COLONOSCOPY WITH PROPOFOL ;  Surgeon: Lamar ONEIDA Holmes, MD;  Location: [facility] ENDOSCOPY;  Service: Endoscopy;  Laterality: N/A;   CYSTOSCOPY  05/05/2016   Procedure: CYSTOSCOPY;  Surgeon: Debby JINNY Dinsmore, MD;  Location: [facility] ORS;  Service: Gynecology;;   ESOPHAGOGASTRODUODENOSCOPY (EGD) WITH PROPOFOL  N/A 04/24/2016   Procedure: ESOPHAGOGASTRODUODENOSCOPY (EGD) WITH PROPOFOL ;  Surgeon: Lamar ONEIDA Holmes, MD;  Location: [facility] ENDOSCOPY;  Service: Endoscopy;  Laterality: N/A;   KNEE ARTHROSCOPY WITH MEDIAL MENISECTOMY Left 02/15/2021   Procedure: Left knee arthroscopic partial  medial meniscectomy;  Surgeon: Tobie Priest, MD;  Location: [facility];  Service: Orthopedics;  Laterality: Left;   LAPAROSCOPIC LYSIS OF ADHESIONS  05/05/2016   Procedure: LAPAROSCOPIC LYSIS OF ADHESIONS, EXTENSIVE PELVIC AND ABDOMINAL;  Surgeon: Debby JINNY Dinsmore, MD;  Location: [facility] ORS;  Service: Gynecology;;   LAPAROSCOPIC LYSIS OF ADHESIONS  05/05/2016   Procedure: LAPAROSCOPIC LYSIS OF ADHESIONS;  Surgeon: Dorothyann LITTIE Husk, MD;  Location: [facility] ORS;  Service: General;;   LAPAROSCOPIC OVARIAN CYSTECTOMY Right 05/05/2016   Procedure: LAPAROSCOPIC EXCISION RIGHT ADNEXAL CYST;  Surgeon: Debby JINNY  Schermerhorn, MD;  Location: [facility] ORS;  Service: Gynecology;  Laterality: Right;   LAPAROSCOPIC SALPINGO OOPHERECTOMY Left 05/05/2016   Procedure: LAPAROSCOPIC SALPINGO OOPHORECTOMY;  Surgeon: Debby JINNY Dinsmore, MD;  Location: [facility] ORS;  Service: Gynecology;  Laterality: Left;   RIGHT OOPHORECTOMY     TUBAL LIGATION      OB History   No obstetric history on file.      Home Medications    Prior to Admission medications   Medication Sig Start Date End Date  Taking? Authorizing Provider  amoxicillin -clavulanate (AUGMENTIN ) 875-125 MG tablet Take 1 tablet by mouth 2 (two) times daily. Patient not taking: Reported on 12/06/2023 07/23/22   Lavell Bari LABOR, FNP  clotrimazole -betamethasone  (LOTRISONE ) cream Apply to affected area 2 times daily 12/06/23   Corlis Burnard DEL, NP  DULoxetine (CYMBALTA) 20 MG capsule  05/22/21   [provider]  metFORMIN (GLUCOPHAGE-XR) 500 MG 24 hr tablet Take 500 mg by mouth. 05/11/23 05/10/24  [provider]  MOUNJARO 5 MG/0.5ML Pen Inject 5 mg into the skin. 12/05/23   [provider]  omeprazole (PRILOSEC) 20 MG capsule Take 20 mg by mouth daily.    [provider]  pantoprazole (PROTONIX) 40 MG tablet Take 40 mg by mouth daily.    [provider]  predniSONE  (STERAPRED UNI-PAK 21 TAB) 10 MG (21) TBPK tablet Take by mouth daily. Take 6 tabs by mouth daily  for 1 days, then 5 tabs for 1 days, then 4 tabs for 1 days, then 3 tabs for 1 days, 2 tabs for 1 days, then 1 tab by mouth daily for 1 days Patient not taking: Reported on 12/06/2023 09/15/23   Tara Shelba SAUNDERS, NP  traZODone (DESYREL) 50 MG tablet Take 25-50 mg by mouth at bedtime.    [provider]    Family History Family History  Problem Relation Age of Onset   Cancer Father        stomach   Lymphoma Father    Fibromyalgia Mother    Stroke Mother    Cancer Mother     Social History Social History   Tobacco Use   Smoking status: Never   Smokeless tobacco: Never  Vaping Use   Vaping status: Never Used  Substance Use Topics   Alcohol use: No   Drug use: No     Allergies   Morphine and codeine   Review of Systems Review of Systems  Constitutional:  Negative for chills and fever.  HENT:  Negative for trouble swallowing and voice change.   Eyes:  Negative for pain, discharge and visual disturbance.  Respiratory:  Negative for cough and shortness of breath.   Cardiovascular:  Negative for chest pain  and palpitations.  Skin:  Positive for color change and rash.     Physical Exam Triage Vital Signs ED Triage Vitals  Encounter Vitals Group     BP 02/24/24 0936 138/89     Girls Systolic BP Percentile --      Girls Diastolic BP Percentile --      Boys Systolic BP Percentile --      Boys Diastolic BP Percentile --      Pulse Rate 02/24/24 0936 90     Resp 02/24/24 0936 18     Temp 02/24/24 0936 98.1 F (36.7 C)     Temp src --      SpO2 02/24/24 0936 96 %     Weight --      Height --  Head Circumference --      Peak Flow --      Pain Score 02/24/24 0945 0     Pain Loc --      Pain Education --      Exclude from Growth Chart --    No data found.  Updated Vital Signs BP 138/89   Pulse 90   Temp 98.1 F (36.7 C)   Resp 18   SpO2 96%   Visual Acuity Right Eye Distance:   Left Eye Distance:   Bilateral Distance:    Right Eye Near:   Left Eye Near:    Bilateral Near:     Physical Exam Constitutional:      General: She is not in acute distress. HENT:     Mouth/Throat:     Mouth: Mucous membranes are moist.     Pharynx: Oropharynx is clear.  Eyes:     General:        Right eye: No discharge.        Left eye: No discharge.     Extraocular Movements: Extraocular movements intact.     Conjunctiva/sclera: Conjunctivae normal.     Pupils: Pupils are equal, round, and reactive to light.  Cardiovascular:     Rate and Rhythm: Normal rate and regular rhythm.  Pulmonary:     Effort: Pulmonary effort is normal. No respiratory distress.  Skin:    General: Skin is warm and dry.     Findings: Rash present.     Comments: Right upper eyelid mildly erythematous.  Erythematous maculopapular rash behind right ear, on right forearm, on left hand.  No open wounds or drainage.  Neurological:     Mental Status: She is alert.      UC Treatments / Results  Labs (all labs ordered are listed, but only abnormal results are displayed) Labs Reviewed - No data to  display  EKG   Radiology No results found.  Procedures Procedures (including critical care time)  Medications Ordered in UC Medications  dexamethasone  (DECADRON ) injection 10 mg (10 mg Intramuscular Given 02/24/24 1029)    Initial Impression / Assessment and Plan / UC Course  I have reviewed the triage vital signs and the nursing notes.  Pertinent labs & imaging results that were available during my care of the patient were reviewed by me and considered in my medical decision making (see chart for details).    Contact dermatitis.  Afebrile and vital signs are stable.  Dexamethasone  given here today but patient declines oral steroid taper.  Instructed her to continue antihistamine such as Zyrtec.  Instructed her to follow-up with her PCP tomorrow.  ED precautions given.  Education provided on contact dermatitis.  She agrees to plan of care.  Final Clinical Impressions(s) / UC Diagnoses   Final diagnoses:  Contact dermatitis, unspecified contact dermatitis type, unspecified trigger     Discharge Instructions      You were given an injection of dexamethasone  here today.  Continue to take an antihistamine such as Zyrtec.  Follow-up with your primary care provider tomorrow.  Go to the emergency department if you have worsening symptoms.     ED Prescriptions   None    PDMP not reviewed this encounter.   Corlis Burnard DEL, NP 02/24/24 [phone]

## 2024-02-24 NOTE — Discharge Instructions (Addendum)
 You were given an injection of dexamethasone  here today.  Continue to take an antihistamine such as Zyrtec.  Follow-up with your primary care provider tomorrow.  Go to the emergency department if you have worsening symptoms.

## 2024-02-24 NOTE — ED Triage Notes (Addendum)
 Patient to Urgent Care with complaints of right sided eyelid itching redness. Some rashes on her bilateral arms/ hands/ right ear.   Symptoms started Friday after doing yard work.   Taking benadryl.

## 2024-05-22 ENCOUNTER — Emergency Department

## 2024-05-22 ENCOUNTER — Encounter: Payer: Self-pay | Admitting: Intensive Care

## 2024-05-22 ENCOUNTER — Other Ambulatory Visit: Payer: Self-pay

## 2024-05-22 ENCOUNTER — Emergency Department
Admission: EM | Admit: 2024-05-22 | Discharge: 2024-05-22 | Disposition: A | Discharge status: Home / Self Care | Source: Home / Self Care

## 2024-05-22 DIAGNOSIS — K2900 Acute gastritis without bleeding: Secondary | ICD-10-CM | POA: Diagnosis not present

## 2024-05-22 DIAGNOSIS — R1013 Epigastric pain: Secondary | ICD-10-CM

## 2024-05-22 DIAGNOSIS — R112 Nausea with vomiting, unspecified: Secondary | ICD-10-CM

## 2024-05-22 DIAGNOSIS — E119 Type 2 diabetes mellitus without complications: Secondary | ICD-10-CM | POA: Diagnosis not present

## 2024-05-22 HISTORY — DX: Type 2 diabetes mellitus without complications: E11.9

## 2024-05-22 LAB — BASIC METABOLIC PANEL WITH GFR
Anion gap: 15 (ref 5–15)
BUN: 19 mg/dL (ref 6–20)
CO2: 22 mmol/L (ref 22–32)
Calcium: 9.2 mg/dL (ref 8.9–10.3)
Chloride: 100 mmol/L (ref 98–111)
Creatinine, Ser: 0.82 mg/dL (ref 0.44–1.00)
GFR, Estimated: >60 mL/min
Glucose, Bld: 134 mg/dL — ABNORMAL HIGH (ref 70–99)
Potassium: 4.1 mmol/L (ref 3.5–5.1)
Sodium: 137 mmol/L (ref 135–145)

## 2024-05-22 LAB — CBC
HCT: 48.4 % — ABNORMAL HIGH (ref 36.0–46.0)
Hemoglobin: 15.8 g/dL — ABNORMAL HIGH (ref 12.0–15.0)
MCH: 28 pg (ref 26.0–34.0)
MCHC: 32.6 g/dL (ref 30.0–36.0)
MCV: 85.7 fL (ref 80.0–100.0)
Platelets: 336 K/uL (ref 150–400)
RBC: 5.65 MIL/uL — ABNORMAL HIGH (ref 3.87–5.11)
RDW: 12.7 % (ref 11.5–15.5)
WBC: 6.5 K/uL (ref 4.0–10.5)
nRBC: 0 % (ref 0.0–0.2)

## 2024-05-22 LAB — TROPONIN T, HIGH SENSITIVITY
Troponin T High Sensitivity: <15 ng/L (ref 0–19)
Troponin T High Sensitivity: <15 ng/L (ref 0–19)

## 2024-05-22 LAB — LIPASE, BLOOD: Lipase: 24 U/L (ref 11–51)

## 2024-05-22 MED ORDER — SODIUM CHLORIDE 0.9 % IV BOLUS
1000.0000 mL | Freq: Once | INTRAVENOUS | Status: AC
  Administered 2024-05-22: 1000 mL via INTRAVENOUS

## 2024-05-22 MED ORDER — METOCLOPRAMIDE HCL 5 MG PO TABS: 5.0000 mg | ORAL_TABLET | Freq: Three times a day (TID) | ORAL | 0 refills | Status: DC | PRN

## 2024-05-22 MED ORDER — PANTOPRAZOLE SODIUM 40 MG PO TBEC: 40.0000 mg | DELAYED_RELEASE_TABLET | Freq: Every day | ORAL | 0 refills | Status: AC

## 2024-05-22 MED ORDER — METOCLOPRAMIDE HCL 5 MG/ML IJ SOLN
5.0000 mg | Freq: Once | INTRAMUSCULAR | Status: AC
  Administered 2024-05-22: 5 mg via INTRAVENOUS
  Filled 2024-05-22: qty 2

## 2024-05-22 MED ORDER — PANTOPRAZOLE SODIUM 40 MG IV SOLR
40.0000 mg | Freq: Once | INTRAVENOUS | Status: AC
  Administered 2024-05-22: 40 mg via INTRAVENOUS
  Filled 2024-05-22: qty 10

## 2024-05-22 MED ORDER — SUCRALFATE 1 GM/10ML PO SUSP: 1.0000 g | Freq: Two times a day (BID) | ORAL | 0 refills | Status: DC

## 2024-05-22 NOTE — ED Triage Notes (Signed)
 Patient reports nausea started on Monday and then emesis and diarrhea began Tuesday. Chest tightness began yesterday and still having diarrhea

## 2024-05-22 NOTE — ED Provider Notes (Signed)
 "  [facility] Provider Note    Event Date/Time   First MD Initiated Contact with Patient 05/22/24 1331     (approximate)   History   Chest Pain and Abdominal Pain   HPI  Tara Young is a 59 y.o. female with a past medical history of diabetes presenting to the emergency department complaining of nausea, vomiting, and diarrhea which has been ongoing for the last 3 days.  The patient reports that she restarted Mounjaro the day before the symptoms began and the last time she tried to take it she had similar GI symptoms.  She reports that yesterday she began having some chest tightness as well that radiated into her right arm.  She states that she has been taking antacids but that she still feels very bloated and gassy.     Physical Exam   Triage Vital Signs: ED Triage Vitals  Encounter Vitals Group     BP 05/22/24 1019 (!) 147/94     Girls Systolic BP Percentile --      Girls Diastolic BP Percentile --      Boys Systolic BP Percentile --      Boys Diastolic BP Percentile --      Pulse Rate 05/22/24 1019 (!) 115     Resp 05/22/24 1019 18     Temp 05/22/24 1019 98.5 F (36.9 C)     Temp Source 05/22/24 1019 Oral     SpO2 05/22/24 1019 95 %     Weight 05/22/24 1016 205 lb (93 kg)     Height 05/22/24 1016 5' 7 (1.702 m)     Head Circumference --      Peak Flow --      Pain Score 05/22/24 1016 7     Pain Loc --      Pain Education --      Exclude from Growth Chart --     Most recent vital signs: Vitals:   05/22/24 1019  BP: (!) 147/94  Pulse: (!) 115  Resp: 18  Temp: 98.5 F (36.9 C)  SpO2: 95%     General: Awake, no distress.  CV:  Good peripheral perfusion.  Resp:  Normal effort.  Abd:  No distention.  Nontender to palpation Other:     ED Results / Procedures / Treatments   Labs (all labs ordered are listed, but only abnormal results are displayed) Labs Reviewed  BASIC METABOLIC PANEL WITH GFR - Abnormal; Notable for the  following components:      Result Value   Glucose, Bld 134 (*)    All other components within normal limits  CBC - Abnormal; Notable for the following components:   RBC 5.65 (*)    Hemoglobin 15.8 (*)    HCT 48.4 (*)    All other components within normal limits  TROPONIN T, HIGH SENSITIVITY  TROPONIN T, HIGH SENSITIVITY     EKG  ED ECG REPORT I, Reche CHRISTELLA Leventhal, the attending physician, personally viewed and interpreted this ECG.  Date: 05/22/2024 Rate: 118bpm Rhythm: Sinus tachycardia QRS Axis: normal Intervals: normal ST/T Wave abnormalities: normal Narrative Interpretation: no evidence of acute ischemia    RADIOLOGY Chest x-ray was independently reviewed and interpreted by myself as no acute pathology.  Radiologist interpretation: IMPRESSION: 1. No acute cardiopulmonary abnormality. 2. Air-fluid levels within upper abdominal bowel loops, which can be seen with ileus or bowel obstruction, and may warrant dedicated abdominal imaging if clinically indicated.    PROCEDURES:  Critical Care performed: No  Procedures   MEDICATIONS ORDERED IN ED: Medications - No data to display   IMPRESSION / MDM / ASSESSMENT AND PLAN / ED COURSE  I reviewed the triage vital signs and the nursing notes.                              Differential diagnosis includes, but is not limited to, biliary disease (biliary colic, acute cholecystitis, cholangitis, choledocholithiasis, etc), intrathoracic causes for epigastric abdominal pain including ACS, gastritis, duodenitis, pancreatitis, small bowel or large bowel obstruction, abdominal aortic aneurysm, hernia, and ulcer(s).   Patient's presentation is most consistent with acute presentation with potential threat to life or bodily function.  Patient is a 59 year old female with past medical history of diabetes presenting to the emergency department complaining of 3 days of nausea, vomiting, and diarrhea after restarting her  Mounjaro.  Patient's blood work is overall unremarkable.  On her chest x-ray it was noted that she had some increased air-fluid levels that could be concerning for an ileus or obstruction.  I did discuss this finding with patient.  She is still having bowel movements and is not having any abdominal pain.  Do not feel that there is any further testing required at this time.  Patient reports that she is feeling significantly improved after treatment with IV fluids, Protonix, and Reglan.  I discussed that I would discharge her with prescriptions for Reglan, Protonix, and Carafate.  Advised her to follow-up with her primary care physician within the next few days.  Also advised her to stop taking her Mounjaro as this may be related to her symptoms.  She will return immediately to the emergency department for new or worsening symptoms.     FINAL CLINICAL IMPRESSION(S) / ED DIAGNOSES   Final diagnoses:  Acute gastritis without hemorrhage, unspecified gastritis type  Epigastric pain  Nausea vomiting and diarrhea     Rx / DC Orders   ED Discharge Orders          Ordered    metoCLOPramide (REGLAN) 5 MG tablet  Every 8 hours PRN        05/22/24 1516    sucralfate (CARAFATE) 1 GM/10ML suspension  2 times daily        05/22/24 1516    pantoprazole (PROTONIX) 40 MG tablet  Daily        05/22/24 1516             Note:  This document was prepared using Dragon voice recognition software and may include unintentional dictation errors.   Rexford Reche HERO, MD 05/22/24 1527  "

## 2024-05-24 ENCOUNTER — Observation Stay

## 2024-05-24 ENCOUNTER — Observation Stay
Admission: EM | Admit: 2024-05-24 | Source: Home / Self Care | Attending: Emergency Medicine | Admitting: Emergency Medicine

## 2024-05-24 ENCOUNTER — Emergency Department

## 2024-05-24 ENCOUNTER — Other Ambulatory Visit: Payer: Self-pay

## 2024-05-24 ENCOUNTER — Encounter: Payer: Self-pay | Admitting: Hospitalist

## 2024-05-24 DIAGNOSIS — Z6832 Body mass index (BMI) 32.0-32.9, adult: Secondary | ICD-10-CM | POA: Diagnosis not present

## 2024-05-24 DIAGNOSIS — R1084 Generalized abdominal pain: Secondary | ICD-10-CM | POA: Insufficient documentation

## 2024-05-24 DIAGNOSIS — K219 Gastro-esophageal reflux disease without esophagitis: Secondary | ICD-10-CM | POA: Insufficient documentation

## 2024-05-24 DIAGNOSIS — J45909 Unspecified asthma, uncomplicated: Secondary | ICD-10-CM | POA: Diagnosis present

## 2024-05-24 DIAGNOSIS — K56600 Partial intestinal obstruction, unspecified as to cause: Secondary | ICD-10-CM | POA: Diagnosis not present

## 2024-05-24 DIAGNOSIS — Z7984 Long term (current) use of oral hypoglycemic drugs: Secondary | ICD-10-CM | POA: Diagnosis not present

## 2024-05-24 DIAGNOSIS — E119 Type 2 diabetes mellitus without complications: Secondary | ICD-10-CM | POA: Diagnosis not present

## 2024-05-24 DIAGNOSIS — R197 Diarrhea, unspecified: Principal | ICD-10-CM

## 2024-05-24 DIAGNOSIS — K56609 Unspecified intestinal obstruction, unspecified as to partial versus complete obstruction: Secondary | ICD-10-CM | POA: Diagnosis not present

## 2024-05-24 DIAGNOSIS — R109 Unspecified abdominal pain: Secondary | ICD-10-CM | POA: Diagnosis present

## 2024-05-24 DIAGNOSIS — R110 Nausea: Secondary | ICD-10-CM

## 2024-05-24 DIAGNOSIS — F4312 Post-traumatic stress disorder, chronic: Secondary | ICD-10-CM | POA: Insufficient documentation

## 2024-05-24 DIAGNOSIS — F4310 Post-traumatic stress disorder, unspecified: Secondary | ICD-10-CM | POA: Diagnosis present

## 2024-05-24 DIAGNOSIS — E669 Obesity, unspecified: Secondary | ICD-10-CM | POA: Insufficient documentation

## 2024-05-24 DIAGNOSIS — Z79899 Other long term (current) drug therapy: Secondary | ICD-10-CM | POA: Insufficient documentation

## 2024-05-24 DIAGNOSIS — E66811 Obesity, class 1: Secondary | ICD-10-CM | POA: Diagnosis not present

## 2024-05-24 DIAGNOSIS — E876 Hypokalemia: Secondary | ICD-10-CM | POA: Insufficient documentation

## 2024-05-24 DIAGNOSIS — R112 Nausea with vomiting, unspecified: Secondary | ICD-10-CM | POA: Insufficient documentation

## 2024-05-24 DIAGNOSIS — F419 Anxiety disorder, unspecified: Secondary | ICD-10-CM | POA: Diagnosis not present

## 2024-05-24 DIAGNOSIS — Z794 Long term (current) use of insulin: Secondary | ICD-10-CM | POA: Insufficient documentation

## 2024-05-24 DIAGNOSIS — F330 Major depressive disorder, recurrent, mild: Secondary | ICD-10-CM | POA: Diagnosis not present

## 2024-05-24 DIAGNOSIS — Z8719 Personal history of other diseases of the digestive system: Secondary | ICD-10-CM

## 2024-05-24 LAB — CBC WITH DIFFERENTIAL/PLATELET
Abs Immature Granulocytes: 0.04 K/uL (ref 0.00–0.07)
Basophils Absolute: 0 K/uL (ref 0.0–0.1)
Basophils Relative: 0 %
Eosinophils Absolute: 0.1 K/uL (ref 0.0–0.5)
Eosinophils Relative: 2 %
HCT: 46.8 % — ABNORMAL HIGH (ref 36.0–46.0)
Hemoglobin: 15.2 g/dL — ABNORMAL HIGH (ref 12.0–15.0)
Immature Granulocytes: 1 %
Lymphocytes Relative: 19 %
Lymphs Abs: 1.5 K/uL (ref 0.7–4.0)
MCH: 28 pg (ref 26.0–34.0)
MCHC: 32.5 g/dL (ref 30.0–36.0)
MCV: 86.2 fL (ref 80.0–100.0)
Monocytes Absolute: 0.9 K/uL (ref 0.1–1.0)
Monocytes Relative: 11 %
Neutro Abs: 5.4 K/uL (ref 1.7–7.7)
Neutrophils Relative %: 67 %
Platelets: 331 K/uL (ref 150–400)
RBC: 5.43 MIL/uL — ABNORMAL HIGH (ref 3.87–5.11)
RDW: 12.7 % (ref 11.5–15.5)
WBC: 7.9 K/uL (ref 4.0–10.5)
nRBC: 0 % (ref 0.0–0.2)

## 2024-05-24 LAB — GLUCOSE, CAPILLARY
Glucose-Capillary: 95 mg/dL (ref 70–99)
Glucose-Capillary: 93 mg/dL (ref 70–99)
Glucose-Capillary: 107 mg/dL — ABNORMAL HIGH (ref 70–99)

## 2024-05-24 LAB — COMPREHENSIVE METABOLIC PANEL WITH GFR
ALT: 49 U/L — ABNORMAL HIGH (ref 0–44)
AST: 27 U/L (ref 15–41)
Albumin: 4.1 g/dL (ref 3.5–5.0)
Alkaline Phosphatase: 96 U/L (ref 38–126)
Anion gap: 13 (ref 5–15)
BUN: 14 mg/dL (ref 6–20)
CO2: 21 mmol/L — ABNORMAL LOW (ref 22–32)
Calcium: 9.3 mg/dL (ref 8.9–10.3)
Chloride: 102 mmol/L (ref 98–111)
Creatinine, Ser: 0.84 mg/dL (ref 0.44–1.00)
GFR, Estimated: >60 mL/min
Glucose, Bld: 137 mg/dL — ABNORMAL HIGH (ref 70–99)
Potassium: 3.5 mmol/L (ref 3.5–5.1)
Sodium: 136 mmol/L (ref 135–145)
Total Bilirubin: 0.4 mg/dL (ref 0.0–1.2)
Total Protein: 6.8 g/dL (ref 6.5–8.1)

## 2024-05-24 LAB — URINALYSIS, W/ REFLEX TO CULTURE (INFECTION SUSPECTED)
Bacteria, UA: NONE SEEN
Bilirubin Urine: NEGATIVE
Glucose, UA: NEGATIVE mg/dL
Hgb urine dipstick: NEGATIVE
Ketones, ur: NEGATIVE mg/dL
Leukocytes,Ua: NEGATIVE
Nitrite: NEGATIVE
Protein, ur: NEGATIVE mg/dL
Specific Gravity, Urine: 1.013 (ref 1.005–1.030)
pH: 5 (ref 5.0–8.0)

## 2024-05-24 LAB — GASTROINTESTINAL PANEL BY PCR, STOOL (REPLACES STOOL CULTURE)

## 2024-05-24 LAB — LACTIC ACID, PLASMA: Lactic Acid, Venous: 1.6 mmol/L (ref 0.5–1.9)

## 2024-05-24 LAB — HEMOGLOBIN A1C
Hgb A1c MFr Bld: 6.5 % — ABNORMAL HIGH (ref 4.8–5.6)
Mean Plasma Glucose: 139.85 mg/dL

## 2024-05-24 LAB — C DIFFICILE QUICK SCREEN W PCR REFLEX
C Diff antigen: NEGATIVE
C Diff interpretation: NOT DETECTED
C Diff toxin: NEGATIVE

## 2024-05-24 LAB — LIPASE, BLOOD: Lipase: 33 U/L (ref 11–51)

## 2024-05-24 LAB — HIV ANTIBODY (ROUTINE TESTING W REFLEX): HIV Screen 4th Generation wRfx: NONREACTIVE

## 2024-05-24 MED ORDER — FENTANYL CITRATE (PF) 50 MCG/ML IJ SOSY
50.0000 ug | PREFILLED_SYRINGE | Freq: Once | INTRAMUSCULAR | Status: AC
  Administered 2024-05-24: 50 ug via INTRAVENOUS
  Filled 2024-05-24: qty 1

## 2024-05-24 MED ORDER — DULOXETINE HCL 30 MG PO CPEP
60.0000 mg | ORAL_CAPSULE | Freq: Every day | ORAL | Status: DC
  Administered 2024-05-24 – 2024-05-26 (×3): 60 mg via ORAL
  Filled 2024-05-24 (×3): qty 2

## 2024-05-24 MED ORDER — ONDANSETRON HCL 4 MG/2ML IJ SOLN
4.0000 mg | INTRAMUSCULAR | Status: AC
  Administered 2024-05-24: 4 mg via INTRAVENOUS
  Filled 2024-05-24: qty 2

## 2024-05-24 MED ORDER — DIATRIZOATE MEGLUMINE & SODIUM 66-10 % PO SOLN
90.0000 mL | Freq: Once | ORAL | Status: AC
  Administered 2024-05-24: 90 mL via ORAL

## 2024-05-24 MED ORDER — SODIUM CHLORIDE 0.9 % IV SOLN: INTRAVENOUS | Status: DC

## 2024-05-24 MED ORDER — IPRATROPIUM-ALBUTEROL 0.5-2.5 (3) MG/3ML IN SOLN: 3.0000 mL | Freq: Four times a day (QID) | RESPIRATORY_TRACT | Status: DC | PRN

## 2024-05-24 MED ORDER — KETOROLAC TROMETHAMINE 30 MG/ML IJ SOLN
30.0000 mg | Freq: Four times a day (QID) | INTRAMUSCULAR | Status: AC | PRN
  Administered 2024-05-24 – 2024-05-26 (×6): 30 mg via INTRAVENOUS
  Filled 2024-05-24 (×6): qty 1

## 2024-05-24 MED ORDER — INSULIN ASPART 100 UNIT/ML IJ SOLN: 0.0000 [IU] | Freq: Three times a day (TID) | INTRAMUSCULAR | Status: DC

## 2024-05-24 MED ORDER — PANTOPRAZOLE SODIUM 40 MG IV SOLR
40.0000 mg | Freq: Two times a day (BID) | INTRAVENOUS | Status: DC
  Administered 2024-05-24 – 2024-05-25 (×3): 40 mg via INTRAVENOUS
  Filled 2024-05-24 (×3): qty 10

## 2024-05-24 MED ORDER — ENOXAPARIN SODIUM 60 MG/0.6ML IJ SOSY
0.5000 mg/kg | PREFILLED_SYRINGE | INTRAMUSCULAR | Status: DC
  Administered 2024-05-24 – 2024-05-25 (×2): 47.5 mg via SUBCUTANEOUS
  Filled 2024-05-24 (×2): qty 0.6

## 2024-05-24 MED ORDER — LACTATED RINGERS IV BOLUS (SEPSIS)
1000.0000 mL | Freq: Once | INTRAVENOUS | Status: AC
  Administered 2024-05-24: 1000 mL via INTRAVENOUS

## 2024-05-24 MED ORDER — OXYCODONE HCL 5 MG PO TABS
5.0000 mg | ORAL_TABLET | ORAL | Status: DC | PRN
  Administered 2024-05-24 – 2024-05-25 (×2): 5 mg via ORAL
  Filled 2024-05-24 (×2): qty 1

## 2024-05-24 MED ORDER — IOHEXOL 300 MG/ML  SOLN
100.0000 mL | Freq: Once | INTRAMUSCULAR | Status: AC | PRN
  Administered 2024-05-24: 100 mL via INTRAVENOUS

## 2024-05-24 MED ORDER — ONDANSETRON HCL 4 MG PO TABS
4.0000 mg | ORAL_TABLET | Freq: Four times a day (QID) | ORAL | Status: DC | PRN
  Administered 2024-05-25: 4 mg via ORAL
  Filled 2024-05-24: qty 1

## 2024-05-24 MED ORDER — KETOROLAC TROMETHAMINE 30 MG/ML IJ SOLN: 15.0000 mg | Freq: Once | INTRAMUSCULAR | Status: DC

## 2024-05-24 MED ORDER — POLYETHYLENE GLYCOL 3350 17 G PO PACK: 17.0000 g | PACK | Freq: Every day | ORAL | Status: DC | PRN

## 2024-05-24 MED ORDER — TRAZODONE HCL 50 MG PO TABS
50.0000 mg | ORAL_TABLET | Freq: Every evening | ORAL | Status: DC | PRN
  Administered 2024-05-24 – 2024-05-25 (×2): 50 mg via ORAL
  Filled 2024-05-24 (×2): qty 1

## 2024-05-24 MED ORDER — ACETAMINOPHEN 650 MG RE SUPP: 650.0000 mg | Freq: Four times a day (QID) | RECTAL | Status: DC | PRN

## 2024-05-24 MED ORDER — INSULIN ASPART 100 UNIT/ML IJ SOLN: 0.0000 [IU] | Freq: Every day | INTRAMUSCULAR | Status: DC

## 2024-05-24 MED ORDER — ENOXAPARIN SODIUM 40 MG/0.4ML IJ SOSY: 40.0000 mg | PREFILLED_SYRINGE | INTRAMUSCULAR | Status: DC

## 2024-05-24 MED ORDER — ACETAMINOPHEN 325 MG PO TABS: 650.0000 mg | ORAL_TABLET | Freq: Four times a day (QID) | ORAL | Status: DC | PRN

## 2024-05-24 MED ORDER — ONDANSETRON HCL 4 MG/2ML IJ SOLN
4.0000 mg | Freq: Four times a day (QID) | INTRAMUSCULAR | Status: DC | PRN
  Administered 2024-05-24 – 2024-05-26 (×4): 4 mg via INTRAVENOUS
  Filled 2024-05-24 (×4): qty 2

## 2024-05-24 NOTE — ED Notes (Signed)
 NURSE HAILEY RN INFORMED OF BED ASSIGNED

## 2024-05-24 NOTE — Consult Note (Signed)
 Subjective:   CC: pSBO  HPI:  Tara Young is a 59 y.o. female who is consulted by [facility] for evaluation of above cc.  Symptoms were first noted several days ago. Pain is sharp, periumbilical.  Associated with persistent nausea vomiting diarrhea, exacerbated by nothing specific      Past Medical History:  has a past medical history of Allergic rhinitis, Anxiety, Cervical radiculitis, Chronic low back pain, DDD (degenerative disc disease), cervical, Diabetes mellitus without complication (HCC), Dysplastic nevus (01/29/2017), Dysplastic nevus (12/04/2017), Dysplastic nevus (09/23/2018), Dysplastic nevus (09/24/2019), Gastritis, GERD (gastroesophageal reflux disease), PNA (pneumonia), PONV (postoperative nausea and vomiting), and Type 2 diabetes mellitus (HCC).  Past Surgical History:  has a past surgical history that includes Abdominal hysterectomy; Colonoscopy with propofol  (N/A, 04/24/2016); Esophagogastroduodenoscopy (egd) with propofol  (N/A, 04/24/2016); Right oophorectomy; Tubal ligation; Laparoscopic ovarian cystectomy (Right, 05/05/2016); Laparoscopic lysis of adhesions (05/05/2016); Cystoscopy (05/05/2016); Laparoscopic salpingo oophorectomy (Left, 05/05/2016); Laparoscopic lysis of adhesions (05/05/2016); and Knee arthroscopy with medial menisectomy (Left, 02/15/2021).  Family History: family history includes Cancer in her father and mother; Fibromyalgia in her mother; Lymphoma in her father; Stroke in her mother.  Social History:  reports that she has never smoked. She has never used smokeless tobacco. She reports that she does not drink alcohol and does not use drugs.  Current Medications:  Prior to Admission medications  Medication Sig Start Date End Date Taking? Authorizing Provider  DULoxetine  (CYMBALTA ) 60 MG capsule Take 60 mg by mouth daily.   Yes [provider]  metFORMIN (GLUCOPHAGE-XR) 500 MG 24 hr tablet Take 500 mg by mouth. 05/11/23 05/24/24 Yes [provider]  pantoprazole  (PROTONIX ) 40 MG tablet Take 1 tablet (40 mg total) by mouth daily. 05/22/24 06/05/24 Yes Mulvihill, Caitlin M, MD  amoxicillin -clavulanate (AUGMENTIN ) 875-125 MG tablet Take 1 tablet by mouth 2 (two) times daily. Patient not taking: Reported on 05/24/2024 07/23/22   Lavell Bari LABOR, FNP  clotrimazole -betamethasone  (LOTRISONE ) cream Apply to affected area 2 times daily Patient not taking: Reported on 05/24/2024 12/06/23   Corlis Burnard DEL, NP  DULoxetine  (CYMBALTA ) 20 MG capsule  05/22/21   [provider]  metoCLOPramide  (REGLAN ) 5 MG tablet Take 1 tablet (5 mg total) by mouth every 8 (eight) hours as needed for nausea. Patient not taking: Reported on 05/24/2024 05/22/24 05/25/24  Mulvihill, Caitlin M, MD  MOUNJARO 5 MG/0.5ML Pen Inject 5 mg into the skin. Patient not taking: Reported on 05/24/2024 12/05/23   [provider]  omeprazole (PRILOSEC) 20 MG capsule Take 20 mg by mouth daily. Patient not taking: Reported on 05/24/2024    [provider]  predniSONE  (STERAPRED UNI-PAK 21 TAB) 10 MG (21) TBPK tablet Take by mouth daily. Take 6 tabs by mouth daily  for 1 days, then 5 tabs for 1 days, then 4 tabs for 1 days, then 3 tabs for 1 days, 2 tabs for 1 days, then 1 tab by mouth daily for 1 days Patient not taking: Reported on 12/06/2023 09/15/23   [name], NP  sucralfate  (CARAFATE ) 1 GM/10ML suspension Take 10 mLs (1 g total) by mouth 2 (two) times daily. Patient not taking: Reported on 05/24/2024 05/22/24 06/05/24  Mulvihill, Caitlin M, MD  traZODone  (DESYREL ) 50 MG tablet Take 25-50 mg by mouth at bedtime. Patient not taking: Reported on 05/24/2024    [provider]    Allergies:  Allergies as of 05/24/2024 - Review Complete 05/24/2024  Allergen Reaction Noted   Codeine  05/22/2024  Morphine and codeine Rash and Other (See Comments) 05/05/2016    ROS:  Pertinent positives and negatives noted in HPI   Objective:     BP 120/71 (BP  Location: Right Arm)   Pulse 95   Temp 98.8 F (37.1 C)   Resp 16   Ht 5' 7 (1.702 m)   Wt 93 kg   SpO2 98%   BMI 32.11 kg/m    Constitutional :  alert, cooperative, appears stated age, and no distress  Respiratory:  Clear to auscultation bilaterally  Cardiovascular:  Regular rate and rhythm  Gastrointestinal: soft, non-tender; bowel sounds normal; no masses,  no organomegaly.   Skin: Cool and moist  Psychiatric: Normal affect, non-agitated, not confused       LABS:     Latest Ref Rng & Units 05/24/2024    5:02 AM 05/22/2024   10:18 AM 02/15/2017   11:25 AM  CMP  Glucose 70 - 99 mg/dL 862  865  897   BUN 6 - 20 mg/dL 14  19  14    Creatinine 0.44 - 1.00 mg/dL 9.15  9.17  9.18   Sodium 135 - 145 mmol/L 136  137  140   Potassium 3.5 - 5.1 mmol/L 3.5  4.1  4.5   Chloride 98 - 111 mmol/L 102  100  103   CO2 22 - 32 mmol/L 21  22  33   Calcium  8.9 - 10.3 mg/dL 9.3  9.2  9.3   Total Protein 6.5 - 8.1 g/dL 6.8   6.6   Total Bilirubin 0.0 - 1.2 mg/dL 0.4   0.4   Alkaline Phos 38 - 126 U/L 96   83   AST 15 - 41 U/L 27   14   ALT 0 - 44 U/L 49   20       Latest Ref Rng & Units 05/24/2024    5:02 AM 05/22/2024   10:18 AM 02/15/2017   11:25 AM  CBC  WBC 4.0 - 10.5 K/uL 7.9  6.5  4.3   Hemoglobin 12.0 - 15.0 g/dL 84.7  84.1  86.1   Hematocrit 36.0 - 46.0 % 46.8  48.4  41.4   Platelets 150 - 400 K/uL 331  336  238.0      RADS: EXAM: CT ABDOMEN AND PELVIS WITH CONTRAST 05/24/2024 06:42:08 AM   TECHNIQUE: CT of the abdomen and pelvis was performed with the administration of intravenous contrast. Multiplanar reformatted images are provided for review. Automated exposure control, iterative reconstruction, and/or weight-based adjustment of the mA/kV was utilized to reduce the radiation dose to as low as reasonably achievable.   COMPARISON: Colon 03/02/2016.   CLINICAL HISTORY: Abdominal pain, acute, nonlocalized; Bowel obstruction suspected; N/V/D w/ abd bloating, suspect  partial SBO.   FINDINGS:   LOWER CHEST: No acute abnormality.   LIVER: Subjective hepatic steatosis. No suspicious liver abnormality.   GALLBLADDER AND BILE DUCTS: Gallbladder is unremarkable. No biliary ductal dilatation.   SPLEEN: No acute abnormality.   PANCREAS: No acute abnormality.   ADRENAL GLANDS: No acute abnormality.   KIDNEYS, URETERS AND BLADDER: No stones in the kidneys or ureters. No hydronephrosis. No perinephric or periureteral stranding. Urinary bladder is unremarkable.   GI AND BOWEL: Stomach demonstrates mild fluid distention. There are scattered loops of mid-small bowel with air-fluid levels measuring up to 3.8 cm in diameter. Relative decreased caliber of the distal small bowel without a clear transition point. No significant bowel wall thickening or inflammation. Sigmoid diverticula  without signs of acute diverticulitis. Normal appendix.   PERITONEUM AND RETROPERITONEUM: No ascites. No free air.   VASCULATURE: Aorta is normal in caliber. Aortic atherosclerotic calcification.   LYMPH NODES: No lymphadenopathy.   REPRODUCTIVE ORGANS: No acute abnormality.   BONES AND SOFT TISSUES: No acute osseous abnormality. No focal soft tissue abnormality.   IMPRESSION: 1. Findings most consistent with early or partial small bowel obstruction; differential includes ileus versus low-grade mechanical obstruction such as adhesions or internal hernia.   Electronically signed by: Waddell Calk MD 05/24/2024 07:00 AM EST RP Workstation: [id number]  Assessment:      Partial small bowel obstruction  Plan:     Despite the CT read above.  Patient comes in with continuous diarrhea and additional 2 recorded bowel movements since being admitted.  Clinically no indications of small bowel obstruction at this time.  Recommend further follow-up and investigation per primary team for her persistent symptoms of nausea vomiting diarrhea along with the abdominal  pain.  Surgery will sign off at this point.  Please call with any additional questions or concerns.    The patient verbalized understanding and all questions were answered to the patient's satisfaction.  labs/images/medications/previous chart entries reviewed personally and relevant changes/updates noted above.

## 2024-05-24 NOTE — ED Triage Notes (Signed)
 Arrived by [facility].  Complaints of ongoing abdominal pain that has gotten worse.  She was here on Wednesday and was told to come in if worse.  Constant diarrhea (water). Still vomiting as well.

## 2024-05-24 NOTE — Plan of Care (Signed)

## 2024-05-24 NOTE — H&P (Signed)
 "  History and Physical    Tara Young FMW:[id number] DOB: [birth year] DOA: 05/24/2024  DOS: the patient was seen and examined on 05/24/2024  PCP: Valora Lynwood FALCON, MD   Patient coming from: Home  I have personally briefly reviewed patient's old medical records in [facility]  Chief Complaint: Abdominal pain for 5 days  HPI: Tara Young is a pleasant 59 y.o. female with medical history significant for diabetes , GERD, obesity, PTSD, anxiety/depression, asthma, history of oophorectomy, tubal ligation, salpingo-oophorectomy, and lysis of adhesions in the past who came into ED complaining of 5 days of nausea vomiting diarrhea for the last 5 days.  Patient came to the emergency room 2 days ago and she was discharged back home from the emergency room after workup was reassuring.  Patient continued to have bloating abdominal sensation, nausea, vomiting, diarrhea which was rather getting worse and came to the emergency room via personal vehicle.  She denies any fever, chills, cough, shortness of breath, chest pain, palpitations, hematuria, dysuria, hematemesis or melena.  ED Course: Upon arrival to the ED, patient is found to have persistent nausea and abdominal discomfort, normal leukocytes, UA negative, lipase 33, lactic acid 1.6, CT abdomen pelvis showed possible SBO.  Surgical team was consulted who advised to admit under medicine for small bowel obstruction.  [facility] service was consulted for evaluation for admission.  Review of Systems:  ROS  All other systems negative except as noted in the HPI.  Past Medical History:  Diagnosis Date   Allergic rhinitis    Anxiety    Cervical radiculitis    Chronic low back pain    DDD (degenerative disc disease), cervical    Diabetes mellitus without complication (HCC)    Dysplastic nevus 01/29/2017   R med breast at [place] - mild   Dysplastic nevus 12/04/2017   LUQA - excision   Dysplastic nevus 09/23/2018   R mid to  low back lat - mild   Dysplastic nevus 09/24/2019   R mid to low back lat - recurrent (shave removal 04/27/22)   Gastritis    GERD (gastroesophageal reflux disease)    PNA (pneumonia)    PONV (postoperative nausea and vomiting)    Type 2 diabetes mellitus (HCC)     Past Surgical History:  Procedure Laterality Date   ABDOMINAL HYSTERECTOMY     COLONOSCOPY WITH PROPOFOL  N/A 04/24/2016   Procedure: COLONOSCOPY WITH PROPOFOL ;  Surgeon: Lamar ONEIDA Holmes, MD;  Location: [facility] ENDOSCOPY;  Service: Endoscopy;  Laterality: N/A;   CYSTOSCOPY  05/05/2016   Procedure: CYSTOSCOPY;  Surgeon: Debby JINNY Dinsmore, MD;  Location: [facility] ORS;  Service: Gynecology;;   ESOPHAGOGASTRODUODENOSCOPY (EGD) WITH PROPOFOL  N/A 04/24/2016   Procedure: ESOPHAGOGASTRODUODENOSCOPY (EGD) WITH PROPOFOL ;  Surgeon: Lamar ONEIDA Holmes, MD;  Location[facility] ENDOSCOPY;  Service: Endoscopy;  Laterality: N/A;   KNEE ARTHROSCOPY WITH MEDIAL MENISECTOMY Left 02/15/2021   Procedure: Left knee arthroscopic partial  medial meniscectomy;  Surgeon: Tobie Priest, MD;  Location: [facility]GERY [facility];  Service: Orthopedics;  Laterality: Left;   LAPAROSCOPIC LYSIS OF ADHESIONS  05/05/2016   Procedure: LAPAROSCOPIC LYSIS OF ADHESIONS, EXTENSIVE PELVIC AND ABDOMINAL;  Surgeon: Debby JINNY Dinsmore, MD;  Location: [facility] ORS;  Service: Gynecology;;   LAPAROSCOPIC LYSIS OF ADHESIONS  05/05/2016   Procedure: LAPAROSCOPIC LYSIS OF ADHESIONS;  Surgeon: Dorothyann LITTIE Husk, MD;  Location: [facility] ORS;  Service: General;;   LAPAROSCOPIC OVARIAN CYSTECTOMY Right 05/05/2016   Procedure: LAPAROSCOPIC EXCISION RIGHT ADNEXAL CYST;  Surgeon: Debby  JINNY Dinsmore, MD;  Location: [facility] ORS;  Service: Gynecology;  Laterality: Right;   LAPAROSCOPIC SALPINGO OOPHERECTOMY Left 05/05/2016   Procedure: LAPAROSCOPIC SALPINGO OOPHORECTOMY;  Surgeon: Debby JINNY Dinsmore, MD;  Location: [facility] ORS;  Service: Gynecology;  Laterality: Left;   RIGHT OOPHORECTOMY     TUBAL LIGATION        reports that she has never smoked. She has never used smokeless tobacco. She reports that she does not drink alcohol and does not use drugs.  Allergies[1]  Family History  Problem Relation Age of Onset   Cancer Father        stomach   Lymphoma Father    Fibromyalgia Mother    Stroke Mother    Cancer Mother     Prior to Admission medications  Medication Sig Start Date End Date Taking? Authorizing Provider  amoxicillin -clavulanate (AUGMENTIN ) 875-125 MG tablet Take 1 tablet by mouth 2 (two) times daily. Patient not taking: Reported on 12/06/2023 07/23/22   Lavell Bari LABOR, FNP  clotrimazole -betamethasone  (LOTRISONE ) cream Apply to affected area 2 times daily 12/06/23   Corlis Burnard DEL, NP  DULoxetine  (CYMBALTA ) 20 MG capsule  05/22/21   [provider]  metFORMIN (GLUCOPHAGE-XR) 500 MG 24 hr tablet Take 500 mg by mouth. 05/11/23 05/10/24  [provider]  metoCLOPramide  (REGLAN ) 5 MG tablet Take 1 tablet (5 mg total) by mouth every 8 (eight) hours as needed for nausea. 05/22/24 05/25/24  Mulvihill, Caitlin M, MD  MOUNJARO 5 MG/0.5ML Pen Inject 5 mg into the skin. 12/05/23   [provider]  omeprazole (PRILOSEC) 20 MG capsule Take 20 mg by mouth daily.    [provider]  pantoprazole  (PROTONIX ) 40 MG tablet Take 1 tablet (40 mg total) by mouth daily. 05/22/24 06/05/24  Mulvihill, Caitlin M, MD  predniSONE  (STERAPRED UNI-PAK 21 TAB) 10 MG (21) TBPK tablet Take by mouth daily. Take 6 tabs by mouth daily  for 1 days, then 5 tabs for 1 days, then 4 tabs for 1 days, then 3 tabs for 1 days, 2 tabs for 1 days, then 1 tab by mouth daily for 1 days Patient not taking: Reported on 12/06/2023 09/15/23   [name], NP  sucralfate  (CARAFATE ) 1 GM/10ML suspension Take 10 mLs (1 g total) by mouth 2 (two) times daily. 05/22/24 06/05/24  Mulvihill, Caitlin M, MD  traZODone  (DESYREL ) 50 MG tablet Take 25-50 mg by mouth at bedtime.    [provider]    Physical  Exam: Vitals:   05/24/24 0730 05/24/24 0735 05/24/24 0834 05/24/24 1001  BP: (!) 121/49   120/71  Pulse: 94   95  Resp:    16  Temp:   98 F (36.7 C) 98.8 F (37.1 C)  TempSrc:   Oral   SpO2: 97% 98%  98%  Weight:      Height:        Physical Exam   Constitutional: Alert, awake, calm, comfortable HEENT: Neck supple Respiratory: Clear to auscultation B/L, no wheezing, no rales.  Cardiovascular: Regular rate and rhythm, no murmurs / rubs / gallops. No extremity edema. 2+ pedal pulses. No carotid bruits.  Abdomen: Soft, no tenderness, Bowel sounds positive.  Musculoskeletal: no clubbing / cyanosis. Good ROM, no contractures. Normal muscle tone.  Skin: no rashes, lesions, ulcers. Neurologic: CN 2-12 grossly intact. Sensation intact, No focal deficit identified Psychiatric: Alert and oriented x 3. Normal mood.    Labs on Admission: I have personally reviewed following labs and imaging studies  CBC: Recent Labs  Lab 05/22/24 1018 05/24/24 0502  WBC 6.5 7.9  NEUTROABS  --  5.4  HGB 15.8* 15.2*  HCT 48.4* 46.8*  MCV 85.7 86.2  PLT 336 331   Basic Metabolic Panel: Recent Labs  Lab 05/22/24 1018 05/24/24 0502  NA 137 136  K 4.1 3.5  CL 100 102  CO2 22 21*  GLUCOSE 134* 137*  BUN 19 14  CREATININE 0.82 0.84  CALCIUM  9.2 9.3   GFR: Estimated Creatinine Clearance: 85.5 mL/min (by C-G formula based on SCr of 0.84 mg/dL). Liver Function Tests: Recent Labs  Lab 05/24/24 0502  AST 27  ALT 49*  ALKPHOS 96  BILITOT 0.4  PROT 6.8  ALBUMIN 4.1   Recent Labs  Lab 05/22/24 1218 05/24/24 0502  LIPASE 24 33   No results for input(s): AMMONIA in the last 168 hours. Coagulation Profile: No results for input(s): INR, PROTIME in the last 168 hours. Cardiac Enzymes: No results for input(s): CKTOTAL, CKMB, CKMBINDEX, TROPONINI, TROPONINIHS in the last 168 hours. BNP (last 3 results) No results for input(s): BNP in the last 8760 hours. HbA1C: No  results for input(s): HGBA1C in the last 72 hours. CBG: No results for input(s): GLUCAP in the last 168 hours. Lipid Profile: No results for input(s): CHOL, HDL, LDLCALC, TRIG, CHOLHDL, LDLDIRECT in the last 72 hours. Thyroid Function Tests: No results for input(s): TSH, T4TOTAL, FREET4, T3FREE, THYROIDAB in the last 72 hours. Anemia Panel: No results for input(s): VITAMINB12, FOLATE, FERRITIN, TIBC, IRON, RETICCTPCT in the last 72 hours. Urine analysis:    Component Value Date/Time   COLORURINE YELLOW (A) 05/24/2024 0502   APPEARANCEUR CLEAR (A) 05/24/2024 0502   LABSPEC 1.013 05/24/2024 0502   PHURINE 5.0 05/24/2024 0502   GLUCOSEU NEGATIVE 05/24/2024 0502   HGBUR NEGATIVE 05/24/2024 0502   BILIRUBINUR NEGATIVE 05/24/2024 0502   BILIRUBINUR Negative 02/06/2017 1502   KETONESUR NEGATIVE 05/24/2024 0502   PROTEINUR NEGATIVE 05/24/2024 0502   UROBILINOGEN 0.2 02/06/2017 1502   NITRITE NEGATIVE 05/24/2024 0502   LEUKOCYTESUR NEGATIVE 05/24/2024 0502    Radiological Exams on Admission: I have personally reviewed images CT ABDOMEN PELVIS W CONTRAST Result Date: 05/24/2024 EXAM: CT ABDOMEN AND PELVIS WITH CONTRAST 05/24/2024 06:42:08 AM TECHNIQUE: CT of the abdomen and pelvis was performed with the administration of intravenous contrast. Multiplanar reformatted images are provided for review. Automated exposure control, iterative reconstruction, and/or weight-based adjustment of the mA/kV was utilized to reduce the radiation dose to as low as reasonably achievable. COMPARISON: Colon 03/02/2016. CLINICAL HISTORY: Abdominal pain, acute, nonlocalized; Bowel obstruction suspected; N/V/D w/ abd bloating, suspect partial SBO. FINDINGS: LOWER CHEST: No acute abnormality. LIVER: Subjective hepatic steatosis. No suspicious liver abnormality. GALLBLADDER AND BILE DUCTS: Gallbladder is unremarkable. No biliary ductal dilatation. SPLEEN: No acute abnormality.  PANCREAS: No acute abnormality. ADRENAL GLANDS: No acute abnormality. KIDNEYS, URETERS AND BLADDER: No stones in the kidneys or ureters. No hydronephrosis. No perinephric or periureteral stranding. Urinary bladder is unremarkable. GI AND BOWEL: Stomach demonstrates mild fluid distention. There are scattered loops of mid-small bowel with air-fluid levels measuring up to 3.8 cm in diameter. Relative decreased caliber of the distal small bowel without a clear transition point. No significant bowel wall thickening or inflammation. Sigmoid diverticula without signs of acute diverticulitis. Normal appendix. PERITONEUM AND RETROPERITONEUM: No ascites. No free air. VASCULATURE: Aorta is normal in caliber. Aortic atherosclerotic calcification. LYMPH NODES: No lymphadenopathy. REPRODUCTIVE ORGANS: No acute abnormality. BONES AND SOFT TISSUES: No acute osseous abnormality. No  focal soft tissue abnormality. IMPRESSION: 1. Findings most consistent with early or partial small bowel obstruction; differential includes ileus versus low-grade mechanical obstruction such as adhesions or internal hernia. Electronically signed by: Waddell Calk MD 05/24/2024 07:00 AM EST RP Workstation: [id number]    EKG: N/A    Assessment/Plan Principal Problem:   SBO (small bowel obstruction) (HCC) Active Problems:   Asthma   History of small bowel obstruction   PTSD (post-traumatic stress disorder)   Major depressive disorder, recurrent, mild   Anxiety disorder   Controlled type 2 diabetes mellitus without complication, without long-term current use of insulin  (HCC)    Assessment and Plan: 59 year old female with history of diabetes, asthma, history of small bowel obstruction with adhesion, anxiety/depression, PTSD who came into ED complaining of nausea vomiting abdominal pain for the last 5 days.  1.  Small bowel obstruction - CT scan showed possible small bowel obstruction. - Surgical team will see the patient. - She will  be placed in observation, n.p.o., IV fluid, pain medications - Follow recommendation from surgical team.  2.  Type 2 diabetes - She will be placed on insulin  sliding scale  3.  Asthma - Stable - As needed nebulization  4.  Anxiety/depression/PTSD - Resume her home Cymbalta   5.  GERD - She will be placed on Protonix        DVT prophylaxis: Lovenox  Code Status: Full Code Family Communication: None Disposition Plan: Home Consults called: General Surgery Admission status: Observation, Med-Surg   Nena Rebel, MD [facility] 05/24/2024, 11:07 AM        [1]  Allergies Allergen Reactions   Codeine    Morphine And Codeine Rash and Other (See Comments)    Redness and pain in chest   "

## 2024-05-24 NOTE — Care Plan (Signed)
 Radiology department called to make aware patient drank all 3 bottles

## 2024-05-24 NOTE — ED Notes (Signed)
 Pt given warm blanket.

## 2024-05-24 NOTE — Progress Notes (Signed)
 PHARMACIST - PHYSICIAN COMMUNICATION  CONCERNING:  Enoxaparin  (Lovenox ) for DVT Prophylaxis    RECOMMENDATION: Patient was prescribed enoxaprin 40mg  q24 hours for VTE prophylaxis.   Filed Weights   05/24/24 0436  Weight: 93 kg (205 lb 0.4 oz)    Body mass index is 32.11 kg/m.  Estimated Creatinine Clearance: 85.5 mL/min (by C-G formula based on SCr of 0.84 mg/dL).   Based on [facility] policy patient is candidate for enoxaparin  0.5mg /kg TBW SQ every 24 hours based on BMI being >30.   DESCRIPTION: Pharmacy has adjusted enoxaparin  dose per [facility] policy.  Patient is now receiving enoxaparin  47.5 mg every 24 hours    Ransom Blanch PGY-1 Pharmacy Resident  [place] [facility]  05/24/2024 8:03 AM

## 2024-05-24 NOTE — ED Provider Notes (Signed)
 "  [facility] Provider Note    Event Date/Time   First MD Initiated Contact with Patient 05/24/24 [id number]     (approximate)   History   Abdominal Pain   HPI Lanna Labella is a 59 y.o. female with medical history that includes obesity, PTSD, anxiety, depression, and well-controlled asthma.  She has had right oophorectomy, tubal ligation, salpingo oophorectomy on the left, and lysis of adhesions.  She has not had an appendectomy or cholecystectomy.  She presents for evaluation of about 5 days of GI discomfort that includes persistent copious watery diarrhea, nausea but minimal vomiting (no vomiting for the last couple days), and a feeling of bloating and generalized abdominal pain that has been getting worse.  She came to the emergency department about 2 days ago and was evaluated and had a generally reassuring workup.  She was comfortable with the plan to go home at that time with Carafate  and Reglan , but it does not seem to help when she said that the diarrhea has persisted and if anything gotten worse, along with the bloating, gassy sensation, and nausea.  No fever, chest pain, shortness of breath.   Physical Exam   Triage Vital Signs: ED Triage Vitals  Encounter Vitals Group     BP 05/24/24 0435 136/73     Girls Systolic BP Percentile --      Girls Diastolic BP Percentile --      Boys Systolic BP Percentile --      Boys Diastolic BP Percentile --      Pulse Rate 05/24/24 0435 95     Resp 05/24/24 0435 19     Temp 05/24/24 0435 97.6 F (36.4 C)     Temp Source 05/24/24 0435 Oral     SpO2 05/24/24 0435 97 %     Weight 05/24/24 0436 93 kg (205 lb 0.4 oz)     Height 05/24/24 0436 1.702 m (5' 7)     Head Circumference --      Peak Flow --      Pain Score 05/24/24 0436 10     Pain Loc --      Pain Education --      Exclude from Growth Chart --     Most recent vital signs: Vitals:   05/24/24 0435  BP: 136/73  Pulse: 95  Resp: 19  Temp:  97.6 F (36.4 C)  SpO2: 97%    General: Awake, generally well-appearing but appears somewhat uncomfortable.  Pleasant and conversant. CV:  Good peripheral perfusion.  Borderline tachycardia, regular rhythm. Resp:  Normal effort. Speaking easily and comfortably, no accessory muscle usage nor intercostal retractions.   Abd:  Soft, obese, and with what appears to be some distention.  Mild generalized tenderness to palpation but no guarding, no peritoneal signs.   ED Results / Procedures / Treatments   Labs (all labs ordered are listed, but only abnormal results are displayed) Labs Reviewed  COMPREHENSIVE METABOLIC PANEL WITH GFR - Abnormal; Notable for the following components:      Result Value   CO2 21 (*)    Glucose, Bld 137 (*)    ALT 49 (*)    All other components within normal limits  CBC WITH DIFFERENTIAL/PLATELET - Abnormal; Notable for the following components:   RBC 5.43 (*)    Hemoglobin 15.2 (*)    HCT 46.8 (*)    All other components within normal limits  URINALYSIS, W/ REFLEX TO CULTURE (INFECTION SUSPECTED) - Abnormal;  Notable for the following components:   Color, Urine YELLOW (*)    APPearance CLEAR (*)    All other components within normal limits  GASTROINTESTINAL PANEL BY PCR, STOOL (REPLACES STOOL CULTURE)  C DIFFICILE QUICK SCREEN W PCR REFLEX    LACTIC ACID, PLASMA  LIPASE, BLOOD     RADIOLOGY See ED course for details   PROCEDURES:  Critical Care performed: No  Procedures    IMPRESSION / MDM / ASSESSMENT AND PLAN / ED COURSE  I reviewed the triage vital signs and the nursing notes.                              Differential diagnosis includes, but is not limited to, diarrheal illness (viral versus bacterial), SBO or partial SBO, ileus, electrolyte disturbance as a result of GI distress  Patient's presentation is most consistent with acute presentation with potential threat to life or bodily function.  Labs/studies ordered (see ED course  for additional labs and studies that may have been added later): GI pathogen panel, C. difficile PCR, urinalysis, lipase, CBC with differential, CMP, lactic acid  Interventions/Medications given:  Medications  lactated ringers  bolus 1,000 mL (0 mLs Intravenous Stopped 05/24/24 0533)  ondansetron  (ZOFRAN ) injection 4 mg (4 mg Intravenous Given 05/24/24 0510)  fentaNYL  (SUBLIMAZE ) injection 50 mcg (50 mcg Intravenous Given 05/24/24 0510)  iohexol  (OMNIPAQUE ) 300 MG/ML solution 100 mL (100 mLs Intravenous Contrast Given 05/24/24 0635)  fentaNYL  (SUBLIMAZE ) injection 50 mcg (50 mcg Intravenous Given 05/24/24 0701)  ondansetron  (ZOFRAN ) injection 4 mg (4 mg Intravenous Given 05/24/24 0701)    (Note:  hospital course my include additional interventions and/or labs/studies not listed above.)   Vital signs stable although the patient has borderline tachycardia.  I reviewed her medical record including her prior ED visit.  The current plan is to evaluate basic labs and if she has significantly elevated LFTs I will proceed with a right upper quadrant ultrasound, but based on the fact that she has some imaging before this suggested some air-fluid levels, I suspect I will proceed with a CT of the abdomen and pelvis to look not only in general for intra-abdominal pathology but specifically to rule out partial SBO.  Clinical Course as of 05/24/24 0729  Sat May 24, 2024  0523 CBC with Differential(!) Somewhat hemoconcentrated but without leukocytosis [CF]  0537 Urinalysis, w/ Reflex to Culture (Infection Suspected) -Urine, Clean Catch(!) Normal UA [CF]  0543 Lipase: 33 [CF]  0543 Comprehensive metabolic panel(!) Stable CMP, slightly decreased CO2 but very minimally so, and very slightly elevated ALT.  I think the highest yield will be with a CT of the abdomen and pelvis to rule out SBO/ileus [CF]  0543 Lactic Acid, Venous: 1.6 [CF]  0718 CT ABDOMEN PELVIS W CONTRAST [CF]  0718 CT ABDOMEN PELVIS W CONTRAST I  independently viewed and interpreted the patient's abd/pelvis CT, as well as reviewing the radiologist's report.  Patient has air-fluid levels worrisome for bowel obstruction and the radiologist confirms early SBO versus partial SBO.  Patient had increased pain and nausea after coming back from the scan and I ordered another dose of fentanyl  50 mcg and Zofran  4 mg IV. [CF]  9278 Consulted Dr. Tye by phone.  He will review the images and let me know his recommendations [CF]    Clinical Course User Index [CF] Gordan Huxley, MD     FINAL CLINICAL IMPRESSION(S) / ED DIAGNOSES   Final  diagnoses:  Partial small bowel obstruction (HCC)  Nausea vomiting and diarrhea  Generalized abdominal pain     Rx / DC Orders   ED Discharge Orders     None        Note:  This document was prepared using Dragon voice recognition software and may include unintentional dictation errors.   Gordan Huxley, MD 05/24/24 [phone]  "

## 2024-05-25 ENCOUNTER — Observation Stay

## 2024-05-25 DIAGNOSIS — R1013 Epigastric pain: Secondary | ICD-10-CM | POA: Diagnosis not present

## 2024-05-25 DIAGNOSIS — E119 Type 2 diabetes mellitus without complications: Secondary | ICD-10-CM

## 2024-05-25 DIAGNOSIS — R197 Diarrhea, unspecified: Principal | ICD-10-CM

## 2024-05-25 DIAGNOSIS — E669 Obesity, unspecified: Secondary | ICD-10-CM | POA: Insufficient documentation

## 2024-05-25 DIAGNOSIS — F330 Major depressive disorder, recurrent, mild: Secondary | ICD-10-CM | POA: Diagnosis not present

## 2024-05-25 DIAGNOSIS — F4310 Post-traumatic stress disorder, unspecified: Secondary | ICD-10-CM | POA: Diagnosis not present

## 2024-05-25 DIAGNOSIS — R110 Nausea: Secondary | ICD-10-CM | POA: Diagnosis not present

## 2024-05-25 DIAGNOSIS — E876 Hypokalemia: Secondary | ICD-10-CM | POA: Insufficient documentation

## 2024-05-25 DIAGNOSIS — R1011 Right upper quadrant pain: Secondary | ICD-10-CM | POA: Diagnosis not present

## 2024-05-25 DIAGNOSIS — R109 Unspecified abdominal pain: Secondary | ICD-10-CM

## 2024-05-25 LAB — CBC
HCT: 35.6 % — ABNORMAL LOW (ref 36.0–46.0)
Hemoglobin: 11.5 g/dL — ABNORMAL LOW (ref 12.0–15.0)
MCH: 28 pg (ref 26.0–34.0)
MCHC: 32.3 g/dL (ref 30.0–36.0)
MCV: 86.8 fL (ref 80.0–100.0)
Platelets: 206 K/uL (ref 150–400)
RBC: 4.1 MIL/uL (ref 3.87–5.11)
RDW: 13 % (ref 11.5–15.5)
WBC: 3.7 K/uL — ABNORMAL LOW (ref 4.0–10.5)
nRBC: 0 % (ref 0.0–0.2)

## 2024-05-25 LAB — COMPREHENSIVE METABOLIC PANEL WITH GFR
ALT: 39 U/L (ref 0–44)
AST: 30 U/L (ref 15–41)
Albumin: 3.1 g/dL — ABNORMAL LOW (ref 3.5–5.0)
Alkaline Phosphatase: 70 U/L (ref 38–126)
Anion gap: 8 (ref 5–15)
BUN: 12 mg/dL (ref 6–20)
CO2: 24 mmol/L (ref 22–32)
Calcium: 8.4 mg/dL — ABNORMAL LOW (ref 8.9–10.3)
Chloride: 110 mmol/L (ref 98–111)
Creatinine, Ser: 0.8 mg/dL (ref 0.44–1.00)
GFR, Estimated: >60 mL/min
Glucose, Bld: 82 mg/dL (ref 70–99)
Potassium: 3.4 mmol/L — ABNORMAL LOW (ref 3.5–5.1)
Sodium: 142 mmol/L (ref 135–145)
Total Bilirubin: 0.3 mg/dL (ref 0.0–1.2)
Total Protein: 5 g/dL — ABNORMAL LOW (ref 6.5–8.1)

## 2024-05-25 LAB — PROTIME-INR
INR: 1.2 (ref 0.8–1.2)
Prothrombin Time: 15.6 s — ABNORMAL HIGH (ref 11.4–15.2)

## 2024-05-25 LAB — GLUCOSE, CAPILLARY
Glucose-Capillary: 80 mg/dL (ref 70–99)
Glucose-Capillary: 68 mg/dL — ABNORMAL LOW (ref 70–99)
Glucose-Capillary: 69 mg/dL — ABNORMAL LOW (ref 70–99)
Glucose-Capillary: 77 mg/dL (ref 70–99)
Glucose-Capillary: 110 mg/dL — ABNORMAL HIGH (ref 70–99)
Glucose-Capillary: 96 mg/dL (ref 70–99)

## 2024-05-25 MED ORDER — FAMOTIDINE 20 MG PO TABS
20.0000 mg | ORAL_TABLET | Freq: Once | ORAL | Status: AC
  Administered 2024-05-25: 20 mg via ORAL
  Filled 2024-05-25: qty 1

## 2024-05-25 MED ORDER — LOPERAMIDE HCL 2 MG PO CAPS: 2.0000 mg | ORAL_CAPSULE | Freq: Three times a day (TID) | ORAL | Status: DC | PRN

## 2024-05-25 MED ORDER — PANTOPRAZOLE SODIUM 40 MG PO TBEC: 40.0000 mg | DELAYED_RELEASE_TABLET | Freq: Every day | ORAL | Status: DC

## 2024-05-25 MED ORDER — SIMETHICONE 80 MG PO CHEW
80.0000 mg | CHEWABLE_TABLET | Freq: Four times a day (QID) | ORAL | Status: DC | PRN
  Administered 2024-05-25 – 2024-05-26 (×2): 80 mg via ORAL
  Filled 2024-05-25 (×4): qty 1

## 2024-05-25 MED ORDER — POTASSIUM CHLORIDE 10 MEQ/100ML IV SOLN
10.0000 meq | INTRAVENOUS | Status: AC
  Administered 2024-05-25 (×2): 10 meq via INTRAVENOUS
  Filled 2024-05-25 (×2): qty 100

## 2024-05-25 MED ORDER — PANTOPRAZOLE SODIUM 40 MG IV SOLR
40.0000 mg | Freq: Every day | INTRAVENOUS | Status: DC
  Administered 2024-05-25: 40 mg via INTRAVENOUS
  Filled 2024-05-25: qty 10

## 2024-05-25 MED ORDER — CALCIUM CARBONATE ANTACID 500 MG PO CHEW
1.0000 | CHEWABLE_TABLET | Freq: Three times a day (TID) | ORAL | Status: DC | PRN
  Administered 2024-05-25 – 2024-05-26 (×3): 200 mg via ORAL
  Filled 2024-05-25 (×3): qty 1

## 2024-05-25 NOTE — Hospital Course (Signed)
 59 y.o. female with medical history significant for diabetes , GERD, obesity, PTSD, anxiety/depression, asthma, history of oophorectomy, tubal ligation, salpingo-oophorectomy, and lysis of adhesions in the past who came into ED complaining of 5 days of nausea vomiting diarrhea for the last 5 days.  Patient came to the emergency room 2 days ago and she was discharged back home from the emergency room after workup was reassuring.  Patient continued to have bloating abdominal sensation, nausea, vomiting, diarrhea which was rather getting worse and came to the emergency room via personal vehicle.  She denies any fever, chills, cough, shortness of breath, chest pain, palpitations, hematuria, dysuria, hematemesis or melena.   ED Course: Upon arrival to the ED, patient is found to have persistent nausea and abdominal discomfort, normal leukocytes, UA negative, lipase 33, lactic acid 1.6, CT abdomen pelvis showed possible SBO.  Surgical team was consulted who advised to admit under medicine for small bowel obstruction.  [facility] hospitalist service was consulted for evaluation for admission.  1/4.  Patient stated she injected Mounjaro on Sunday and has had diarrhea since.  Has upset stomach and some pain.  No vomiting but some nausea.  Will also hold Glucophage.  Sugars on the lower side but asymptomatic. 1/5.  Patient still does not feel great but feels good enough to go home.  Patient will use her Protonix  twice a day for a week or 2.

## 2024-05-25 NOTE — Assessment & Plan Note (Signed)
 On Cymbalta 

## 2024-05-25 NOTE — Progress Notes (Signed)
 " Progress Note   Patient: Tara Young FMW:[id number] DOB: October 24, 1965 DOA: 05/24/2024     0 DOS: the patient was seen and examined on 05/25/2024   Brief hospital course: 59 y.o. female with medical history significant for diabetes , GERD, obesity, PTSD, anxiety/depression, asthma, history of oophorectomy, tubal ligation, salpingo-oophorectomy, and lysis of adhesions in the past who came into ED complaining of 5 days of nausea vomiting diarrhea for the last 5 days.  Patient came to the emergency room 2 days ago and she was discharged back home from the emergency room after workup was reassuring.  Patient continued to have bloating abdominal sensation, nausea, vomiting, diarrhea which was rather getting worse and came to the emergency room via personal vehicle.  She denies any fever, chills, cough, shortness of breath, chest pain, palpitations, hematuria, dysuria, hematemesis or melena.   ED Course: Upon arrival to the ED, patient is found to have persistent nausea and abdominal discomfort, normal leukocytes, UA negative, lipase 33, lactic acid 1.6, CT abdomen pelvis showed possible SBO.  Surgical team was consulted who advised to admit under medicine for small bowel obstruction.  [facility] hospitalist service was consulted for evaluation for admission.  1/4.  Patient stated she injected Mounjaro on Sunday and has had diarrhea since.  Has upset stomach and some pain.  No vomiting but some nausea.  Will also hold Glucophage.  Sugars on the lower side but asymptomatic.  Assessment and Plan: * Diarrhea Stool studies negative.  Can take an as needed Imodium .  I think this is likely secondary to taking Mounjaro high dose on Sunday where she was off of it for a period of time before hand.  Nausea Able to tolerate diet.  Patient wanted to do a dose of IV Protonix  this evening.  Abdominal pain CT scan reviewed by surgeon and he does not believe that this is a small bowel obstruction.  Right upper quadrant  ultrasound negative for gallstones.  Controlled type 2 diabetes mellitus without complication, without long-term current use of insulin  (HCC) Will recommend holding Mounjaro and Glucophage especially if having diarrhea.  Hemoglobin A1c on the lower side at 6.5.  Hypokalemia Replace potassium IV.  Obesity (BMI 30-39.9) Class I with a BMI of 32.11  Major depressive disorder, recurrent, mild On Cymbalta   PTSD (post-traumatic stress disorder) On Cymbalta         Subjective: Patient seen this morning and again this afternoon.  Still not feeling quite right.  Still having some diarrhea.  Still having some abdominal discomfort.  Some nausea but no vomiting.  Physical Exam: Vitals:   05/24/24 1546 05/24/24 2000 05/25/24 0346 05/25/24 0841  BP: (!) 121/58 (!) 124/56 (!) 104/44 118/78  Pulse: 88 85 72 80  Resp: 16 17 16 17   Temp: 98.3 F (36.8 C) 98.3 F (36.8 C) 97.8 F (36.6 C) 98.2 F (36.8 C)  TempSrc:      SpO2: 94% 97% 94% 97%  Weight:      Height:       Physical Exam HENT:     Head: Normocephalic.  Eyes:     General: Lids are normal.     Conjunctiva/sclera: Conjunctivae normal.  Cardiovascular:     Rate and Rhythm: Normal rate and regular rhythm.     Heart sounds: Normal heart sounds, S1 normal and S2 normal.  Pulmonary:     Breath sounds: No decreased breath sounds, wheezing, rhonchi or rales.  Abdominal:     Palpations: Abdomen is soft.  Tenderness: There is abdominal tenderness in the right upper quadrant and right lower quadrant.  Musculoskeletal:     Right lower leg: No swelling.     Left lower leg: No swelling.  Skin:    General: Skin is warm.     Findings: No rash.  Neurological:     Mental Status: She is alert and oriented to person, place, and time.     Data Reviewed: Potassium 3.4, creatinine 0.8, white blood cell count 3.7, hemoglobin 11.5, platelet count 206, hemoglobin A1c 6.5, INR 1.2  Disposition: Status is: Observation Patient still  not feeling right after eating lunch with some abdominal pain and had a couple episodes of diarrhea earlier.  Will watch another night in the hospital.  Planned Discharge Destination: Home    Time spent: 32 minutes Case discussed with nursing staff Author: Charlie Patterson, MD 05/25/2024 1:20 PM  For on call review [email or web address].  "

## 2024-05-25 NOTE — Plan of Care (Signed)

## 2024-05-25 NOTE — Assessment & Plan Note (Signed)
 Able to tolerate diet.  Patient wanted to do a dose of IV Protonix  this evening.

## 2024-05-25 NOTE — Assessment & Plan Note (Signed)
 CT scan reviewed by surgeon and he does not believe that this is a small bowel obstruction.  Right upper quadrant ultrasound negative for gallstones.

## 2024-05-25 NOTE — Progress Notes (Signed)
 Pt's CBG was 69 at 1138. Pt drank 8oz of orange juice. Recheck at 1158 was 68. MD Weiting notified. Pt ate lunch, and CBG rechecked after lunch was 77. Pt was given snacks and juice and encouraged to eat them as able. MD Weiting aware, will continue to monitor.

## 2024-05-25 NOTE — Assessment & Plan Note (Signed)
 Class I with a BMI of 32.11

## 2024-05-25 NOTE — Assessment & Plan Note (Addendum)
 Stool studies negative.  Can take an as needed Imodium .  I think this is likely secondary to taking Mounjaro high dose on Sunday where she was off of it for a period of time before hand.

## 2024-05-25 NOTE — Assessment & Plan Note (Signed)
 Will recommend holding Mounjaro and Glucophage especially if having diarrhea.  Hemoglobin A1c on the lower side at 6.5.

## 2024-05-25 NOTE — Plan of Care (Signed)
" °  Problem: Clinical Measurements: Goal: Diagnostic test results will improve Outcome: Not Progressing   Problem: Clinical Measurements: Goal: Will remain free from infection Outcome: Not Progressing   Problem: Nutrition: Goal: Adequate nutrition will be maintained Outcome: Not Progressing   Problem: Elimination: Goal: Will not experience complications related to bowel motility Outcome: Not Progressing   Problem: Pain Managment: Goal: General experience of comfort will improve and/or be controlled Outcome: Not Progressing   Problem: Fluid Volume: Goal: Ability to maintain a balanced intake and output will improve Outcome: Not Progressing   Problem: Metabolic: Goal: Ability to maintain appropriate glucose levels will improve Outcome: Not Progressing   "

## 2024-05-25 NOTE — Assessment & Plan Note (Signed)
 Replaced

## 2024-05-26 ENCOUNTER — Other Ambulatory Visit: Payer: Self-pay

## 2024-05-26 DIAGNOSIS — E119 Type 2 diabetes mellitus without complications: Secondary | ICD-10-CM | POA: Diagnosis not present

## 2024-05-26 DIAGNOSIS — R110 Nausea: Secondary | ICD-10-CM | POA: Diagnosis not present

## 2024-05-26 DIAGNOSIS — R1013 Epigastric pain: Secondary | ICD-10-CM

## 2024-05-26 DIAGNOSIS — E876 Hypokalemia: Secondary | ICD-10-CM

## 2024-05-26 DIAGNOSIS — R197 Diarrhea, unspecified: Secondary | ICD-10-CM | POA: Diagnosis not present

## 2024-05-26 LAB — MAGNESIUM: Magnesium: 2.1 mg/dL (ref 1.7–2.4)

## 2024-05-26 LAB — BASIC METABOLIC PANEL WITH GFR
Anion gap: 9 (ref 5–15)
BUN: 9 mg/dL (ref 6–20)
CO2: 28 mmol/L (ref 22–32)
Calcium: 8.8 mg/dL — ABNORMAL LOW (ref 8.9–10.3)
Chloride: 108 mmol/L (ref 98–111)
Creatinine, Ser: 0.78 mg/dL (ref 0.44–1.00)
GFR, Estimated: >60 mL/min
Glucose, Bld: 97 mg/dL (ref 70–99)
Potassium: 3.7 mmol/L (ref 3.5–5.1)
Sodium: 144 mmol/L (ref 135–145)

## 2024-05-26 LAB — GLUCOSE, CAPILLARY
Glucose-Capillary: 101 mg/dL — ABNORMAL HIGH (ref 70–99)
Glucose-Capillary: 107 mg/dL — ABNORMAL HIGH (ref 70–99)

## 2024-05-26 MED ORDER — SIMETHICONE 80 MG PO CHEW
80.0000 mg | CHEWABLE_TABLET | Freq: Four times a day (QID) | ORAL | 0 refills | Status: AC | PRN
  Filled 2024-05-26: qty 30, 8d supply, fill #0

## 2024-05-26 MED ORDER — PANTOPRAZOLE SODIUM 40 MG IV SOLR
40.0000 mg | Freq: Two times a day (BID) | INTRAVENOUS | Status: DC
  Administered 2024-05-26: 40 mg via INTRAVENOUS
  Filled 2024-05-26: qty 10

## 2024-05-26 MED ORDER — ONDANSETRON HCL 4 MG PO TABS
4.0000 mg | ORAL_TABLET | Freq: Four times a day (QID) | ORAL | 0 refills | Status: AC | PRN
  Filled 2024-05-26: qty 20, 5d supply, fill #0

## 2024-05-26 MED ORDER — CALCIUM CARBONATE ANTACID 500 MG PO CHEW: 1.0000 | CHEWABLE_TABLET | Freq: Three times a day (TID) | ORAL | Status: AC | PRN

## 2024-05-26 NOTE — Discharge Instructions (Addendum)
 Can take protonix  twice for two weeks or get over the counter omepazole or nexium  Stop monjauro and hold metformin (for now)

## 2024-05-26 NOTE — Inpatient Diabetes Management (Signed)
 Inpatient Diabetes Program Recommendations  AACE/ADA: New Consensus Statement on Inpatient Glycemic Control   Target Ranges:  Prepandial:   less than 140 mg/dL      Peak postprandial:   less than 180 mg/dL (1-2 hours)      Critically ill patients:  140 - 180 mg/dL    Latest Reference Range & Units 05/25/24 08:01 05/25/24 11:38 05/25/24 11:57 05/25/24 12:28 05/25/24 16:11 05/25/24 20:09  Glucose-Capillary 70 - 99 mg/dL 80 69 (L) 68 (L) 77 889 (H) 96    Latest Reference Range & Units 05/24/24 11:33 05/24/24 16:06 05/24/24 21:16  Glucose-Capillary 70 - 99 mg/dL 95 93 892 (H)    Latest Reference Range & Units 05/24/24 05:02  Hemoglobin A1C 4.8 - 5.6 % 6.5 (H)   Review of Glycemic Control  Diabetes history: DM2 Outpatient Diabetes medications: Mounjaro 5 mg Qweek (took 05/18/24), Metformin XR 500 mg daily Current orders for Inpatient glycemic control: Novolog  0-9 units TID with meals, Novolog  0-5 units QHS  Inpatient Diabetes Program Recommendations:    Outpatient DM: Per chart, patient restarted Mounjaro and took it on 05/18/24 and has had GI symptoms since then. Would recommend patient not resume Mounjaro as an outpatient and that she follow up with PCP regarding DM control.  Thanks, Earnie Gainer, RN, MSN, CDCES Diabetes Coordinator Inpatient Diabetes Program [phone] (Team Pager from 8am to 5pm)

## 2024-05-26 NOTE — Plan of Care (Signed)
" °  Problem: Clinical Measurements: Goal: Will remain free from infection Outcome: Not Progressing   Problem: Activity: Goal: Risk for activity intolerance will decrease Outcome: Not Progressing   Problem: Elimination: Goal: Will not experience complications related to bowel motility Outcome: Not Progressing   Problem: Elimination: Goal: Will not experience complications related to urinary retention Outcome: Not Progressing   Problem: Safety: Goal: Ability to remain free from injury will improve Outcome: Not Progressing   Problem: Skin Integrity: Goal: Risk for impaired skin integrity will decrease Outcome: Not Progressing   "

## 2024-05-26 NOTE — Care Plan (Signed)
 DC instruction given to patient  IV removed Pharmacy made aware of med to bed order All questions answered, no other concerns at this time

## 2024-05-26 NOTE — Discharge Summary (Signed)
 " Physician Discharge Summary   Patient: Tara Young MRN: [id number] DOB: 1965/06/11  Admit date:     05/24/2024  Discharge date: 05/26/2024  Discharge Physician: Charlie Patterson   PCP: Valora Lynwood FALCON, MD   Recommendations at discharge:   Follow-up PCP 5 days  Discharge Diagnoses: Principal Problem:   Diarrhea Active Problems:   Nausea   Abdominal pain   Controlled type 2 diabetes mellitus without complication, without long-term current use of insulin  (HCC)   Asthma   PTSD (post-traumatic stress disorder)   Major depressive disorder, recurrent, mild   Anxiety disorder   Obesity (BMI 30-39.9)   Hypokalemia    Hospital Course: 59 y.o. female with medical history significant for diabetes , GERD, obesity, PTSD, anxiety/depression, asthma, history of oophorectomy, tubal ligation, salpingo-oophorectomy, and lysis of adhesions in the past who came into ED complaining of 5 days of nausea vomiting diarrhea for the last 5 days.  Patient came to the emergency room 2 days ago and she was discharged back home from the emergency room after workup was reassuring.  Patient continued to have bloating abdominal sensation, nausea, vomiting, diarrhea which was rather getting worse and came to the emergency room via personal vehicle.  She denies any fever, chills, cough, shortness of breath, chest pain, palpitations, hematuria, dysuria, hematemesis or melena.   ED Course: Upon arrival to the ED, patient is found to have persistent nausea and abdominal discomfort, normal leukocytes, UA negative, lipase 33, lactic acid 1.6, CT abdomen pelvis showed possible SBO.  Surgical team was consulted who advised to admit under medicine for small bowel obstruction.  [facility] service was consulted for evaluation for admission.  1/4.  Patient stated she injected Mounjaro on Sunday and has had diarrhea since.  Has upset stomach and some pain.  No vomiting but some nausea.  Will also hold Glucophage.   Sugars on the lower side but asymptomatic. 1/5.  Patient still does not feel great but feels good enough to go home.  Patient will use her Protonix  twice a day for a week or 2.  Assessment and Plan: * Diarrhea Improved.  Stool studies negative.  I think this is likely secondary to taking Mounjaro high dose on Sunday where she was off of it for a period of time before hand.  Nausea Able to tolerate diet.  Felt better after IV Protonix .  As needed nausea medication to go home with.  Can use her own Protonix  twice a day or get over-the-counter Nexium and use Protonix  in the morning and Nexium at night.  Can do twice a day PPI for a week or 2.  Abdominal pain CT scan reviewed by surgeon and he does not believe that this is a small bowel obstruction.  Right upper quadrant ultrasound negative for gallstones.  Lipase normal range.  Continue PPI.  Controlled type 2 diabetes mellitus without complication, without long-term current use of insulin  (HCC) Will recommend discontinue Mounjaro and holding Glucophage while having abdominal symptoms.  Hemoglobin A1c on the lower side at 6.5.  Hypokalemia Replaced  Obesity (BMI 30-39.9) Class I with a BMI of 32.11  Major depressive disorder, recurrent, mild On Cymbalta   PTSD (post-traumatic stress disorder) On Cymbalta          Consultants: None Procedures performed: None Disposition: Home Diet recommendation:  Regular diet DISCHARGE MEDICATION: Allergies as of 05/26/2024       Reactions   Codeine    Morphine And Codeine Rash, Other (See Comments)   Redness and  pain in chest        Medication List     STOP taking these medications    amoxicillin -clavulanate 875-125 MG tablet Commonly known as: AUGMENTIN    clotrimazole -betamethasone  cream Commonly known as: LOTRISONE    metFORMIN 500 MG 24 hr tablet Commonly known as: GLUCOPHAGE-XR   metoCLOPramide  5 MG tablet Commonly known as: Reglan    Mounjaro 5 MG/0.5ML Pen Generic  drug: tirzepatide   omeprazole 20 MG capsule Commonly known as: PRILOSEC   predniSONE  10 MG (21) Tbpk tablet Commonly known as: STERAPRED UNI-PAK 21 TAB   sucralfate  1 GM/10ML suspension Commonly known as: Carafate        TAKE these medications    calcium  carbonate 500 MG chewable tablet Commonly known as: TUMS - dosed in mg elemental calcium  Chew 1 tablet (200 mg of elemental calcium  total) by mouth 3 (three) times daily as needed for indigestion or heartburn.   DULoxetine  60 MG capsule Commonly known as: CYMBALTA  Take 60 mg by mouth daily. What changed: Another medication with the same name was removed. Continue taking this medication, and follow the directions you see here.   ondansetron  4 MG tablet Commonly known as: ZOFRAN  Take 1 tablet (4 mg total) by mouth every 6 (six) hours as needed for nausea.   pantoprazole  40 MG tablet Commonly known as: Protonix  Take 1 tablet (40 mg total) by mouth daily.   simethicone  80 MG chewable tablet Commonly known as: MYLICON Chew 1 tablet (80 mg total) by mouth every 6 (six) hours as needed for flatulence.   traZODone  50 MG tablet Commonly known as: DESYREL  Take 25-50 mg by mouth at bedtime.        Follow-up Information     Valora Lynwood FALCON, MD Follow up in 5 day(s).   Specialty: Family Medicine Contact information: [address] [place] [address] [phone]                Discharge Exam: Fredricka Weights   05/24/24 0436  Weight: 93 kg   Physical Exam HENT:     Head: Normocephalic.  Eyes:     General: Lids are normal.     Conjunctiva/sclera: Conjunctivae normal.  Cardiovascular:     Rate and Rhythm: Normal rate and regular rhythm.     Heart sounds: Normal heart sounds, S1 normal and S2 normal.  Pulmonary:     Breath sounds: No decreased breath sounds, wheezing, rhonchi or rales.  Abdominal:     Palpations: Abdomen is soft.     Tenderness: There is abdominal tenderness in the  epigastric area.  Musculoskeletal:     Right lower leg: No swelling.     Left lower leg: No swelling.  Skin:    General: Skin is warm.     Findings: No rash.  Neurological:     Mental Status: She is alert and oriented to person, place, and time.      Condition at discharge: stable  The results of significant diagnostics from this hospitalization (including imaging, microbiology, ancillary and laboratory) are listed below for reference.   Imaging Studies: US  Abdomen Limited RUQ (LIVER/GB) Result Date: 05/25/2024 CLINICAL DATA:  Right upper quadrant abdominal pain EXAM: ULTRASOUND ABDOMEN LIMITED RIGHT UPPER QUADRANT COMPARISON:  Yesterday FINDINGS: Gallbladder: No gallstones or wall thickening visualized. No sonographic Murphy sign noted by sonographer. Common bile duct: Diameter: 4 mm which is within normal limits. Liver: No focal lesion identified. Increased echogenicity of hepatic parenchyma is noted suggesting hepatic steatosis. Portal vein  is patent on color Doppler imaging with normal direction of blood flow towards the liver. Other: None. IMPRESSION: Hepatic steatosis. No other abnormality seen in the right upper quadrant of the abdomen. Electronically Signed   By: Lynwood Landy Raddle M.D.   On: 05/25/2024 10:12   DG Abd Portable 1V-Small Bowel Obstruction Protocol-initial, 8 hr delay Result Date: 05/24/2024 EXAM: 1 VIEW XRAY OF THE ABDOMEN 05/24/2024 08:40:16 PM COMPARISON: CT abdomen and pelvis 05/24/2024. CLINICAL HISTORY: Small bowel obstruction suspected. 8-hour delay after contrast administration. FINDINGS: BOWEL: Contrast material is demonstrated throughout the colon extending from cecum to rectum. Minimal residual small bowel contrast. No gaseous distention of small bowel. No evidence of small bowel obstruction. SOFT TISSUES: No radiopaque stones. Soft tissue contours appear intact. BONES: Visualized bones appear intact. IMPRESSION: 1. No evidence of small bowel obstruction.  Electronically signed by: Elsie Gravely MD 05/24/2024 08:44 PM EST RP Workstation: [id number]   CT ABDOMEN PELVIS W CONTRAST Result Date: 05/24/2024 EXAM: CT ABDOMEN AND PELVIS WITH CONTRAST 05/24/2024 06:42:08 AM TECHNIQUE: CT of the abdomen and pelvis was performed with the administration of intravenous contrast. Multiplanar reformatted images are provided for review. Automated exposure control, iterative reconstruction, and/or weight-based adjustment of the mA/kV was utilized to reduce the radiation dose to as low as reasonably achievable. COMPARISON: Colon 03/02/2016. CLINICAL HISTORY: Abdominal pain, acute, nonlocalized; Bowel obstruction suspected; N/V/D w/ abd bloating, suspect partial SBO. FINDINGS: LOWER CHEST: No acute abnormality. LIVER: Subjective hepatic steatosis. No suspicious liver abnormality. GALLBLADDER AND BILE DUCTS: Gallbladder is unremarkable. No biliary ductal dilatation. SPLEEN: No acute abnormality. PANCREAS: No acute abnormality. ADRENAL GLANDS: No acute abnormality. KIDNEYS, URETERS AND BLADDER: No stones in the kidneys or ureters. No hydronephrosis. No perinephric or periureteral stranding. Urinary bladder is unremarkable. GI AND BOWEL: Stomach demonstrates mild fluid distention. There are scattered loops of mid-small bowel with air-fluid levels measuring up to 3.8 cm in diameter. Relative decreased caliber of the distal small bowel without a clear transition point. No significant bowel wall thickening or inflammation. Sigmoid diverticula without signs of acute diverticulitis. Normal appendix. PERITONEUM AND RETROPERITONEUM: No ascites. No free air. VASCULATURE: Aorta is normal in caliber. Aortic atherosclerotic calcification. LYMPH NODES: No lymphadenopathy. REPRODUCTIVE ORGANS: No acute abnormality. BONES AND SOFT TISSUES: No acute osseous abnormality. No focal soft tissue abnormality. IMPRESSION: 1. Findings most consistent with early or partial small bowel obstruction;  differential includes ileus versus low-grade mechanical obstruction such as adhesions or internal hernia. Electronically signed by: Waddell Calk MD 05/24/2024 07:00 AM EST RP Workstation: [id number]   DG Chest 2 View Result Date: 05/22/2024 EXAM: 2 VIEW(S) XRAY OF THE CHEST 05/22/2024 10:30:00 AM COMPARISON: None available. CLINICAL HISTORY: cp FINDINGS: LUNGS AND PLEURA: Lung volumes are low. No focal pulmonary opacity. No pleural effusion. No pneumothorax. HEART AND MEDIASTINUM: No acute abnormality of the cardiac and mediastinal silhouettes. BONES AND SOFT TISSUES: No acute osseous abnormality. UPPER ABDOMEN: Air-fluid levels are present within loops of bowel in the upper abdomen. IMPRESSION: 1. No acute cardiopulmonary abnormality. 2. Air-fluid levels within upper abdominal bowel loops, which can be seen with ileus or bowel obstruction, and may warrant dedicated abdominal imaging if clinically indicated. Electronically signed by: Lonni Necessary MD 05/22/2024 11:31 AM EST RP Workstation: [id number]    Microbiology: Results for orders placed or performed during the hospital encounter of 05/24/24  Gastrointestinal Panel by PCR , Stool     Status: None   Collection Time: 05/24/24  8:32 AM   Specimen: Stool  Result Value Ref  Range Status   Campylobacter species NOT DETECTED NOT DETECTED Final   Plesimonas shigelloides NOT DETECTED NOT DETECTED Final   Salmonella species NOT DETECTED NOT DETECTED Final   Yersinia enterocolitica NOT DETECTED NOT DETECTED Final   Vibrio species NOT DETECTED NOT DETECTED Final   Vibrio cholerae NOT DETECTED NOT DETECTED Final   Enteroaggregative E coli (EAEC) NOT DETECTED NOT DETECTED Final   Enteropathogenic E coli (EPEC) NOT DETECTED NOT DETECTED Final   Enterotoxigenic E coli (ETEC) NOT DETECTED NOT DETECTED Final   Shiga like toxin producing E coli (STEC) NOT DETECTED NOT DETECTED Final   Shigella/Enteroinvasive E coli (EIEC) NOT DETECTED NOT DETECTED Final    Cryptosporidium NOT DETECTED NOT DETECTED Final   Cyclospora cayetanensis NOT DETECTED NOT DETECTED Final   Entamoeba histolytica NOT DETECTED NOT DETECTED Final   Giardia lamblia NOT DETECTED NOT DETECTED Final   Adenovirus F40/41 NOT DETECTED NOT DETECTED Final   Astrovirus NOT DETECTED NOT DETECTED Final   Norovirus GI/GII NOT DETECTED NOT DETECTED Final   Rotavirus A NOT DETECTED NOT DETECTED Final   Sapovirus (I, II, IV, and V) NOT DETECTED NOT DETECTED Final    Comment: Performed at [facility], [address] [place], [place] [address]  C Difficile Quick Screen w PCR reflex     Status: None   Collection Time: 05/24/24  8:32 AM   Specimen: Stool  Result Value Ref Range Status   C Diff antigen NEGATIVE NEGATIVE Final   C Diff toxin NEGATIVE NEGATIVE Final   C Diff interpretation No C. difficile detected.  Final    Comment: Performed at [facility], [address] [place], [place] [address]    Labs: CBC: Recent Labs  Lab 05/22/24 1018 05/24/24 0502 05/25/24 0431  WBC 6.5 7.9 3.7*  NEUTROABS  --  5.4  --   HGB 15.8* 15.2* 11.5*  HCT 48.4* 46.8* 35.6*  MCV 85.7 86.2 86.8  PLT 336 331 206   Basic Metabolic Panel: Recent Labs  Lab 05/22/24 1018 05/24/24 0502 05/25/24 0431 05/26/24 0517  NA 137 136 142 144  K 4.1 3.5 3.4* 3.7  CL 100 102 110 108  CO2 22 21* 24 28  GLUCOSE 134* 137* 82 97  BUN 19 14 12 9   CREATININE 0.82 0.84 0.80 0.78  CALCIUM  9.2 9.3 8.4* 8.8*  MG  --   --   --  2.1   Liver Function Tests: Recent Labs  Lab 05/24/24 0502 05/25/24 0431  AST 27 30  ALT 49* 39  ALKPHOS 96 70  BILITOT 0.4 0.3  PROT 6.8 5.0*  ALBUMIN 4.1 3.1*   CBG: Recent Labs  Lab 05/25/24 1228 05/25/24 1611 05/25/24 2009 05/26/24 0821 05/26/24 1158  GLUCAP 77 110* 96 101* 107*    Discharge time spent: greater than 30 minutes.  Signed: Charlie Patterson, MD [facility] 05/26/2024 "

## 2024-05-26 NOTE — TOC CM/SW Note (Signed)
 Transition of Care [facility]) - Inpatient Brief Assessment   Patient Details  Name: Tara Young MRN: [id number] Date of Birth: 02/11/1966  Transition [facility]) CM/SW Contact:    Daved JONETTA Hamilton, RN Phone Number: 05/26/2024, 10:05 AM   Clinical Narrative:   Transition of Care (TOC) Screening Note   Patient Details  Name: Tara Young Date of Birth: 1965-12-07   Transition of Care [facility]) CM/SW Contact:    Daved JONETTA Hamilton, RN Phone Number: 05/26/2024, 10:05 AM    Transition of Care Department [facility]) has reviewed patient and no TOC needs have been identified at this time. If new patient transition needs arise, please place a TOC consult.    Transition of Care Asessment: Insurance and Status: Insurance coverage has been reviewed Patient has primary care physician: Yes   Prior level of function:: Independent Prior/Current Home Services: No current home services Social Drivers of Health Review: SDOH reviewed no interventions necessary Readmission risk has been reviewed: No (Patient is observation status, no score generated) Transition of care needs: no transition of care needs at this time

## 2024-11-03 ENCOUNTER — Ambulatory Visit: Admitting: Dermatology
